# Patient Record
Sex: Female | Born: 1950 | Race: White | Hispanic: No | Marital: Married | Smoking: Never smoker
Health system: Southern US, Community
[De-identification: ages and names within clinical notes are randomized; demographics above are authoritative.]

## PROBLEM LIST (undated history)

## (undated) DIAGNOSIS — R3915 Urgency of urination: Secondary | ICD-10-CM

## (undated) DIAGNOSIS — M1990 Unspecified osteoarthritis, unspecified site: Secondary | ICD-10-CM

## (undated) DIAGNOSIS — Z9289 Personal history of other medical treatment: Secondary | ICD-10-CM

## (undated) DIAGNOSIS — K219 Gastro-esophageal reflux disease without esophagitis: Secondary | ICD-10-CM

## (undated) DIAGNOSIS — M549 Dorsalgia, unspecified: Secondary | ICD-10-CM

## (undated) DIAGNOSIS — M62838 Other muscle spasm: Secondary | ICD-10-CM

## (undated) DIAGNOSIS — F329 Major depressive disorder, single episode, unspecified: Secondary | ICD-10-CM

## (undated) DIAGNOSIS — F32A Depression, unspecified: Secondary | ICD-10-CM

## (undated) DIAGNOSIS — R112 Nausea with vomiting, unspecified: Secondary | ICD-10-CM

## (undated) DIAGNOSIS — D649 Anemia, unspecified: Secondary | ICD-10-CM

## (undated) DIAGNOSIS — R011 Cardiac murmur, unspecified: Secondary | ICD-10-CM

## (undated) DIAGNOSIS — Z860100 Personal history of colon polyps, unspecified: Secondary | ICD-10-CM

## (undated) DIAGNOSIS — R197 Diarrhea, unspecified: Secondary | ICD-10-CM

## (undated) DIAGNOSIS — E785 Hyperlipidemia, unspecified: Secondary | ICD-10-CM

## (undated) DIAGNOSIS — R42 Dizziness and giddiness: Secondary | ICD-10-CM

## (undated) DIAGNOSIS — R350 Frequency of micturition: Secondary | ICD-10-CM

## (undated) DIAGNOSIS — R600 Localized edema: Secondary | ICD-10-CM

## (undated) DIAGNOSIS — F419 Anxiety disorder, unspecified: Secondary | ICD-10-CM

## (undated) DIAGNOSIS — R609 Edema, unspecified: Secondary | ICD-10-CM

## (undated) DIAGNOSIS — G4730 Sleep apnea, unspecified: Secondary | ICD-10-CM

## (undated) DIAGNOSIS — Z8709 Personal history of other diseases of the respiratory system: Secondary | ICD-10-CM

## (undated) DIAGNOSIS — Z9889 Other specified postprocedural states: Secondary | ICD-10-CM

## (undated) DIAGNOSIS — E039 Hypothyroidism, unspecified: Secondary | ICD-10-CM

## (undated) DIAGNOSIS — N3010 Interstitial cystitis (chronic) without hematuria: Secondary | ICD-10-CM

## (undated) DIAGNOSIS — Z8669 Personal history of other diseases of the nervous system and sense organs: Secondary | ICD-10-CM

## (undated) DIAGNOSIS — I499 Cardiac arrhythmia, unspecified: Secondary | ICD-10-CM

## (undated) DIAGNOSIS — J189 Pneumonia, unspecified organism: Secondary | ICD-10-CM

## (undated) DIAGNOSIS — G8929 Other chronic pain: Secondary | ICD-10-CM

## (undated) HISTORY — DX: Major depressive disorder, single episode, unspecified: F32.9

## (undated) HISTORY — PX: LAPAROSCOPIC GASTROTOMY W/ REPAIR OF ULCER: SUR772

## (undated) HISTORY — DX: Anxiety disorder, unspecified: F41.9

## (undated) HISTORY — DX: Depression, unspecified: F32.A

## (undated) HISTORY — DX: Hypothyroidism, unspecified: E03.9

## (undated) HISTORY — PX: COLONOSCOPY: SHX174

## (undated) HISTORY — PX: OTHER SURGICAL HISTORY: SHX169

## (undated) HISTORY — DX: Gastro-esophageal reflux disease without esophagitis: K21.9

## (undated) HISTORY — DX: Dorsalgia, unspecified: M54.9

## (undated) HISTORY — DX: Interstitial cystitis (chronic) without hematuria: N30.10

## (undated) HISTORY — DX: Cardiac murmur, unspecified: R01.1

## (undated) HISTORY — PX: ESOPHAGOGASTRODUODENOSCOPY: SHX1529

---
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
## 1986-11-15 HISTORY — PX: ABDOMINAL HYSTERECTOMY: SHX81

## 2000-11-15 HISTORY — PX: OTHER SURGICAL HISTORY: SHX169

## 2001-11-06 ENCOUNTER — Encounter: Payer: Self-pay | Admitting: Internal Medicine

## 2001-11-06 ENCOUNTER — Ambulatory Visit (HOSPITAL_COMMUNITY): Admission: RE | Admit: 2001-11-06 | Discharge: 2001-11-06 | Payer: Self-pay | Admitting: Internal Medicine

## 2008-08-10 ENCOUNTER — Emergency Department (HOSPITAL_BASED_OUTPATIENT_CLINIC_OR_DEPARTMENT_OTHER): Admission: EM | Admit: 2008-08-10 | Discharge: 2008-08-10 | Payer: Self-pay | Admitting: Emergency Medicine

## 2008-11-15 HISTORY — PX: CHOLECYSTECTOMY: SHX55

## 2011-08-16 LAB — CBC
HCT: 41
Hemoglobin: 14
MCHC: 34
MCV: 84.8
Platelets: 355
RBC: 4.84
RDW: 11.1 — ABNORMAL LOW
WBC: 11.2 — ABNORMAL HIGH

## 2011-08-16 LAB — URINALYSIS, ROUTINE W REFLEX MICROSCOPIC
Bilirubin Urine: NEGATIVE
Glucose, UA: NEGATIVE
Hgb urine dipstick: NEGATIVE
Ketones, ur: NEGATIVE
Nitrite: NEGATIVE
Protein, ur: NEGATIVE
Specific Gravity, Urine: 1.01
Urobilinogen, UA: 0.2
pH: 8.5 — ABNORMAL HIGH

## 2011-08-16 LAB — DIFFERENTIAL
Basophils Absolute: 0
Basophils Relative: 0
Eosinophils Absolute: 0.1
Eosinophils Relative: 1
Lymphocytes Relative: 17
Lymphs Abs: 1.9
Monocytes Absolute: 0.7
Monocytes Relative: 6
Neutro Abs: 8.5 — ABNORMAL HIGH
Neutrophils Relative %: 76

## 2011-08-16 LAB — BASIC METABOLIC PANEL
BUN: 11
CO2: 26
Calcium: 9.9
Chloride: 103
Creatinine, Ser: 0.8
GFR calc Af Amer: >60
GFR calc non Af Amer: >60
Glucose, Bld: 106 — ABNORMAL HIGH
Potassium: 3.9
Sodium: 142

## 2011-08-16 LAB — POCT TOXICOLOGY PANEL: Opiates: POSITIVE

## 2011-08-16 LAB — ETHANOL: Alcohol, Ethyl (B): <5

## 2012-10-03 ENCOUNTER — Encounter: Payer: Self-pay | Admitting: Thoracic Surgery (Cardiothoracic Vascular Surgery)

## 2012-10-10 ENCOUNTER — Encounter: Payer: Self-pay | Admitting: *Deleted

## 2012-10-10 ENCOUNTER — Encounter: Payer: Self-pay | Admitting: Thoracic Surgery (Cardiothoracic Vascular Surgery)

## 2012-10-10 ENCOUNTER — Institutional Professional Consult (permissible substitution) (INDEPENDENT_AMBULATORY_CARE_PROVIDER_SITE_OTHER): Payer: PRIVATE HEALTH INSURANCE | Admitting: Thoracic Surgery (Cardiothoracic Vascular Surgery)

## 2012-10-10 VITALS — BP 126/76 | HR 89 | Resp 16 | Ht 63.0 in | Wt 130.0 lb

## 2012-10-10 DIAGNOSIS — M5124 Other intervertebral disc displacement, thoracic region: Secondary | ICD-10-CM

## 2012-10-10 NOTE — Progress Notes (Signed)
PCP is No primary provider on file. Referring Provider is Pool, Kathaleen Maser, MD  Chief Complaint  Patient presents with  . Herniated Nucleus Propulsus    at T7-8...evaluate for thoracic exposure    HPI: 61 year old woman with chief complaint of back pain and right leg weakness.  Tiffany Villarreal is a 61 year old woman who is been evaluated by Dr. Jordan Likes for microdiscectomy of the thoracic spine T7-T8 level. She has been having a great deal of difficulty with back pain, peripheral neuropathy, and variable weakness in the right leg. An MRI showed herniated disc with impingement on the spinal cord and he has recommended surgery to correct that.   Past Medical History  Diagnosis Date  . Back pain   . Anxiety   . Interstitial cystitis   . Depression   . Hypothyroidism   . Hypertension     Past Surgical History  Procedure Date  . Laparoscopic gastrotomy w/ repair of ulcer   . Cholecystectomy   . Ceasarian   . Tonsillectomy   . Abdominal hysterectomy     No family history on file.  Social History History  Substance Use Topics  . Smoking status: Never Smoker   . Smokeless tobacco: Never Used  . Alcohol Use: No    Current Outpatient Prescriptions  Medication Sig Dispense Refill  . busPIRone (BUSPAR) 15 MG tablet Take 15 mg by mouth 3 (three) times daily.      Marland Kitchen estrogens, conjugated, (PREMARIN) 0.3 MG tablet Take 0.3 mg by mouth daily. Take daily for 21 days then do not take for 7 days.      Marland Kitchen Fe-Succ Ac-C-Thre Ac-B12-FA (TRIFEREX 150 FORTE PO) Take by mouth. Two caps in the morning, two caps in the afternoon      . l-methylfolate-B6-B12 (METANX) 3-35-2 MG TABS Take 1 tablet by mouth daily.      . Meth-Hyo-M Bl-Na Phos-Ph Sal (URIBEL) 118 MG CAPS Take by mouth as needed.      . metoprolol succinate (TOPROL-XL) 50 MG 24 hr tablet Take 50 mg by mouth daily. Take with or immediately following a meal.      . mirtazapine (REMERON) 30 MG tablet Take 30 mg by mouth at bedtime.      .  pentosan polysulfate (ELMIRON) 100 MG capsule Take 100 mg by mouth. Two caps in the morning, two caps at bedtime      . Pitavastatin Calcium (LIVALO) 4 MG TABS Take by mouth every morning.      . sertraline (ZOLOFT) 100 MG tablet Take 100 mg by mouth daily.      Marland Kitchen acetaminophen (TYLENOL) 325 MG tablet Take 650 mg by mouth every 6 (six) hours as needed.      . ALPRAZolam (XANAX) 0.5 MG tablet Take 0.5 mg by mouth at bedtime as needed.      Marland Kitchen buPROPion (WELLBUTRIN SR) 150 MG 12 hr tablet Take 150 mg by mouth 2 (two) times daily.      . DULoxetine (CYMBALTA) 60 MG capsule Take 60 mg by mouth daily.      . furosemide (LASIX) 20 MG tablet Take 20 mg by mouth daily.      Marland Kitchen levothyroxine (SYNTHROID, LEVOTHROID) 88 MCG tablet Take 88 mcg by mouth daily.      . metoCLOPramide (REGLAN) 10 MG tablet Take 10 mg by mouth 4 (four) times daily.      Marland Kitchen olanzapine-FLUoxetine (SYMBYAX) 6-50 MG per capsule Take 1 capsule by mouth every evening.      Marland Kitchen  pregabalin (LYRICA) 150 MG capsule Take 150 mg by mouth 2 (two) times daily.      Marland Kitchen tiZANidine (ZANAFLEX) 2 MG tablet Take 2 mg by mouth every 6 (six) hours as needed.      . zolmitriptan (ZOMIG) 5 MG tablet Take 5 mg by mouth as needed.        Allergies  Allergen Reactions  . Dilaudid (Hydromorphone Hcl) Other (See Comments)    Mental, psychotic  . Morphine And Related Swelling    Of lips and tongue  . Tetanus Toxoids Other (See Comments)    fever  . Compazine (Prochlorperazine Edisylate) Other (See Comments)    Stiff neck    Review of Systems  Constitutional: Positive for activity change and unexpected weight change.  HENT: Positive for hearing loss.   Respiratory: Positive for apnea.   Genitourinary:       Interstitial cystitis  Musculoskeletal: Positive for joint swelling and gait problem.  Neurological: Positive for weakness (right leg) and numbness.       Neuropathy, pain in legs and feet  Psychiatric/Behavioral:       Anxiety, depression  All  other systems reviewed and are negative.    BP 126/76  Pulse 89  Resp 16  Ht 5\' 3"  (1.6 m)  Wt 130 lb (58.968 kg)  BMI 23.03 kg/m2  SpO2 98% Physical Exam  Constitutional: She is oriented to person, place, and time. She appears well-developed and well-nourished.       very anxious  HENT:  Head: Normocephalic and atraumatic.  Eyes: EOM are normal. Pupils are equal, round, and reactive to light.  Neck: Neck supple. No thyromegaly present.  Cardiovascular: Normal rate, regular rhythm, normal heart sounds and intact distal pulses.  Exam reveals no gallop and no friction rub.   No murmur heard. Pulmonary/Chest: Breath sounds normal. She has no wheezes. She has no rales.  Abdominal: Soft. There is no tenderness.  Musculoskeletal: She exhibits no edema.  Lymphadenopathy:    She has no cervical adenopathy.  Neurological: She is alert and oriented to person, place, and time. No cranial nerve deficit.  Skin: Skin is warm and dry.     Diagnostic Tests: MR of spine 09/01/2012 Central herniated disc at T7-T8 with mild cord compression but no nerve root compression. Enhancing lesion of T9 vertebral body.  Impression: 61 year old woman with a herniated disc he needs thoracic exposure for disc surgery. I discussed with her the incision that we would use, use of a double-lumen endotracheal tube, chest tube management postoperatively, and pain management postoperatively. We discussed the risks associated with the exposure including those related to general anesthesia, bleeding, infection, and pain issues both acute and chronic. She understands those issues but is very anxious about having surgery. She has had adverse effects narcotics in the past and is likely to have issues in that regard again. She also has had problems with severe nausea following surgery, and I did warn her that that is likely to occur as well.  Plan: I will be happy to provide exposure for Dr. Jordan Likes, we will coordinate  scheduling with his office.

## 2012-10-20 ENCOUNTER — Other Ambulatory Visit: Payer: Self-pay | Admitting: Neurosurgery

## 2012-10-23 ENCOUNTER — Encounter (HOSPITAL_COMMUNITY): Payer: Self-pay | Admitting: Pharmacy Technician

## 2012-10-27 ENCOUNTER — Encounter (HOSPITAL_COMMUNITY): Payer: Self-pay

## 2012-10-27 ENCOUNTER — Ambulatory Visit (HOSPITAL_COMMUNITY)
Admission: RE | Admit: 2012-10-27 | Discharge: 2012-10-27 | Disposition: A | Discharge status: Home / Self Care | Payer: PRIVATE HEALTH INSURANCE | Source: Ambulatory Visit | Attending: Anesthesiology | Admitting: Anesthesiology

## 2012-10-27 ENCOUNTER — Encounter (HOSPITAL_COMMUNITY)
Admission: RE | Admit: 2012-10-27 | Discharge: 2012-10-27 | Disposition: A | Discharge status: Home / Self Care | Payer: PRIVATE HEALTH INSURANCE | Source: Ambulatory Visit | Attending: Neurosurgery | Admitting: Neurosurgery

## 2012-10-27 DIAGNOSIS — M5104 Intervertebral disc disorders with myelopathy, thoracic region: Secondary | ICD-10-CM | POA: Insufficient documentation

## 2012-10-27 DIAGNOSIS — Z01818 Encounter for other preprocedural examination: Secondary | ICD-10-CM | POA: Insufficient documentation

## 2012-10-27 HISTORY — DX: Personal history of other diseases of the nervous system and sense organs: Z86.69

## 2012-10-27 HISTORY — DX: Nausea with vomiting, unspecified: Z98.890

## 2012-10-27 HISTORY — DX: Other chronic pain: G89.29

## 2012-10-27 HISTORY — DX: Anemia, unspecified: D64.9

## 2012-10-27 HISTORY — DX: Pneumonia, unspecified organism: J18.9

## 2012-10-27 HISTORY — DX: Cardiac arrhythmia, unspecified: I49.9

## 2012-10-27 HISTORY — DX: Dorsalgia, unspecified: M54.9

## 2012-10-27 HISTORY — DX: Edema, unspecified: R60.9

## 2012-10-27 HISTORY — DX: Localized edema: R60.0

## 2012-10-27 HISTORY — DX: Other muscle spasm: M62.838

## 2012-10-27 HISTORY — DX: Personal history of colon polyps, unspecified: Z86.0100

## 2012-10-27 HISTORY — DX: Personal history of other diseases of the respiratory system: Z87.09

## 2012-10-27 HISTORY — DX: Dizziness and giddiness: R42

## 2012-10-27 HISTORY — DX: Hyperlipidemia, unspecified: E78.5

## 2012-10-27 HISTORY — DX: Personal history of colonic polyps: Z86.010

## 2012-10-27 HISTORY — DX: Urgency of urination: R39.15

## 2012-10-27 HISTORY — DX: Unspecified osteoarthritis, unspecified site: M19.90

## 2012-10-27 HISTORY — DX: Other specified postprocedural states: R11.2

## 2012-10-27 HISTORY — DX: Diarrhea, unspecified: R19.7

## 2012-10-27 HISTORY — DX: Personal history of other medical treatment: Z92.89

## 2012-10-27 HISTORY — DX: Sleep apnea, unspecified: G47.30

## 2012-10-27 HISTORY — DX: Frequency of micturition: R35.0

## 2012-10-27 LAB — CBC WITH DIFFERENTIAL/PLATELET
Eosinophils Relative: 2 % (ref 0–5)
HCT: 39.6 % (ref 36.0–46.0)
Hemoglobin: 12.8 g/dL (ref 12.0–15.0)
Lymphocytes Relative: 22 % (ref 12–46)
Lymphs Abs: 1.8 10*3/uL (ref 0.7–4.0)
MCV: 90.6 fL (ref 78.0–100.0)
Monocytes Absolute: 0.7 10*3/uL (ref 0.1–1.0)
Neutro Abs: 5.3 10*3/uL (ref 1.7–7.7)
RBC: 4.37 MIL/uL (ref 3.87–5.11)
WBC: 7.9 10*3/uL (ref 4.0–10.5)

## 2012-10-27 LAB — BASIC METABOLIC PANEL
BUN: 17 mg/dL (ref 6–23)
CO2: 27 mEq/L (ref 19–32)
Chloride: 104 mEq/L (ref 96–112)
Glucose, Bld: 92 mg/dL (ref 70–99)
Potassium: 3.5 mEq/L (ref 3.5–5.1)

## 2012-10-27 LAB — TYPE AND SCREEN: ABO/RH(D): B POS

## 2012-10-27 LAB — ABO/RH: ABO/RH(D): B POS

## 2012-10-27 NOTE — Progress Notes (Signed)
Cardiologist is Dr Eliot Ford with [facility] in [place]-hasn't seen him in yrs and only has to see on an as needed basis  Echo and stress test done 15+yrs ago Denies ever having a heart cath  Medical MD is Dr.William Ameen with [facility] in [place]    Denies EKG and CXR within past yr

## 2012-10-27 NOTE — Progress Notes (Signed)
Sleep study done in 2000/ 2005-setting at a 10-uses CPAP

## 2012-10-27 NOTE — Pre-Procedure Instructions (Signed)
20 Ceilidh Torregrossa  10/27/2012   Your procedure is scheduled on:  Mon, Dec 16 @ 7:30 AM  Report to [facility] at 5:30 AM.  Call this number if you have problems the morning of surgery: [phone]   Remember:   Do not eat food:After Midnight.  Take these medicines the morning of surgery with A SIP OF WATER: Sertraline(Zoloft),Metoprolol(Toprol),Pain Pill(if needed),Buspar(Buspirone)Azelastine(Astelin--if needed), and Adderall   Do not wear jewelry, make-up or nail polish.  Do not wear lotions, powders, or perfumes. You may wear deodorant.  Do not shave 48 hours prior to surgery.   Do not bring valuables to the hospital.  Contacts, dentures or bridgework may not be worn into surgery.  Leave suitcase in the car. After surgery it may be brought to your room.  For patients admitted to the hospital, checkout time is 11:00 AM the day of discharge.   Patients discharged the day of surgery will not be allowed to drive home.    Special Instructions: Shower using CHG 2 nights before surgery and the night before surgery.  If you shower the day of surgery use CHG.  Use special wash - you have one bottle of CHG for all showers.  You should use approximately 1/3 of the bottle for each shower.   Please read over the following fact sheets that you were given: Pain Booklet, Coughing and Deep Breathing, Blood Transfusion Information, MRSA Information and Surgical Site Infection Prevention

## 2012-10-29 MED ORDER — CEFAZOLIN SODIUM-DEXTROSE 2-3 GM-% IV SOLR
2.0000 g | INTRAVENOUS | Status: AC
  Administered 2012-10-30: 2 g via INTRAVENOUS
  Filled 2012-10-29: qty 50

## 2012-10-29 MED ORDER — DEXAMETHASONE SODIUM PHOSPHATE 10 MG/ML IJ SOLN
10.0000 mg | INTRAMUSCULAR | Status: AC
  Administered 2012-10-30: 10 mg via INTRAVENOUS
  Filled 2012-10-29: qty 1

## 2012-10-30 ENCOUNTER — Inpatient Hospital Stay (HOSPITAL_COMMUNITY): Payer: PRIVATE HEALTH INSURANCE

## 2012-10-30 ENCOUNTER — Encounter (HOSPITAL_COMMUNITY)
Admission: RE | Disposition: A | Discharge status: Home / Self Care | Payer: Self-pay | Source: Ambulatory Visit | Attending: Neurosurgery

## 2012-10-30 ENCOUNTER — Encounter (HOSPITAL_COMMUNITY): Payer: Self-pay | Admitting: Certified Registered Nurse Anesthetist

## 2012-10-30 ENCOUNTER — Inpatient Hospital Stay (HOSPITAL_COMMUNITY)
Admission: RE | Admit: 2012-10-30 | Discharge: 2012-11-01 | DRG: 491 | Disposition: A | Discharge status: Home / Self Care | Payer: PRIVATE HEALTH INSURANCE | Source: Ambulatory Visit | Attending: Neurosurgery | Admitting: Neurosurgery

## 2012-10-30 ENCOUNTER — Ambulatory Visit (HOSPITAL_COMMUNITY): Payer: PRIVATE HEALTH INSURANCE

## 2012-10-30 ENCOUNTER — Ambulatory Visit (HOSPITAL_COMMUNITY): Payer: PRIVATE HEALTH INSURANCE | Admitting: Certified Registered Nurse Anesthetist

## 2012-10-30 ENCOUNTER — Encounter (HOSPITAL_COMMUNITY): Payer: Self-pay | Admitting: Surgery

## 2012-10-30 DIAGNOSIS — M5104 Intervertebral disc disorders with myelopathy, thoracic region: Principal | ICD-10-CM | POA: Diagnosis present

## 2012-10-30 DIAGNOSIS — F329 Major depressive disorder, single episode, unspecified: Secondary | ICD-10-CM | POA: Diagnosis present

## 2012-10-30 DIAGNOSIS — Z79899 Other long term (current) drug therapy: Secondary | ICD-10-CM

## 2012-10-30 DIAGNOSIS — F3289 Other specified depressive episodes: Secondary | ICD-10-CM | POA: Diagnosis present

## 2012-10-30 DIAGNOSIS — I499 Cardiac arrhythmia, unspecified: Secondary | ICD-10-CM | POA: Diagnosis present

## 2012-10-30 DIAGNOSIS — G4730 Sleep apnea, unspecified: Secondary | ICD-10-CM | POA: Diagnosis present

## 2012-10-30 DIAGNOSIS — F411 Generalized anxiety disorder: Secondary | ICD-10-CM | POA: Diagnosis present

## 2012-10-30 DIAGNOSIS — E785 Hyperlipidemia, unspecified: Secondary | ICD-10-CM | POA: Diagnosis present

## 2012-10-30 DIAGNOSIS — R209 Unspecified disturbances of skin sensation: Secondary | ICD-10-CM | POA: Diagnosis present

## 2012-10-30 HISTORY — PX: THORACIC DISCECTOMY: SHX6113

## 2012-10-30 HISTORY — PX: THORACIC EXPOSURE: SHX6114

## 2012-10-30 SURGERY — THORACIC DISCECTOMY
Anesthesia: General | Site: Back | Laterality: Right | Wound class: Clean

## 2012-10-30 MED ORDER — LIDOCAINE HCL (CARDIAC) 20 MG/ML IV SOLN
INTRAVENOUS | Status: DC | PRN
  Administered 2012-10-30: 65 mg via INTRAVENOUS

## 2012-10-30 MED ORDER — MIDAZOLAM HCL 2 MG/2ML IJ SOLN: 1.0000 mg | INTRAMUSCULAR | Status: DC | PRN

## 2012-10-30 MED ORDER — LACTATED RINGERS IV SOLN
INTRAVENOUS | Status: DC | PRN
  Administered 2012-10-30: 07:00:00 via INTRAVENOUS

## 2012-10-30 MED ORDER — ALUM & MAG HYDROXIDE-SIMETH 200-200-20 MG/5ML PO SUSP: 30.0000 mL | Freq: Four times a day (QID) | ORAL | Status: DC | PRN

## 2012-10-30 MED ORDER — FENTANYL CITRATE 0.05 MG/ML IJ SOLN
INTRAMUSCULAR | Status: DC | PRN
  Administered 2012-10-30 (×2): 100 ug via INTRAVENOUS
  Administered 2012-10-30: 50 ug via INTRAVENOUS

## 2012-10-30 MED ORDER — BUSPIRONE HCL 15 MG PO TABS
15.0000 mg | ORAL_TABLET | Freq: Three times a day (TID) | ORAL | Status: DC
  Administered 2012-10-30 – 2012-11-01 (×6): 15 mg via ORAL
  Filled 2012-10-30 (×9): qty 1

## 2012-10-30 MED ORDER — PENTOSAN POLYSULFATE SODIUM 100 MG PO CAPS
100.0000 mg | ORAL_CAPSULE | Freq: Two times a day (BID) | ORAL | Status: DC
  Administered 2012-10-30 – 2012-11-01 (×5): 100 mg via ORAL
  Filled 2012-10-30 (×6): qty 1

## 2012-10-30 MED ORDER — MENTHOL 3 MG MT LOZG: 1.0000 | LOZENGE | OROMUCOSAL | Status: DC | PRN

## 2012-10-30 MED ORDER — METOPROLOL SUCCINATE ER 50 MG PO TB24
50.0000 mg | ORAL_TABLET | Freq: Every day | ORAL | Status: DC
  Filled 2012-10-30 (×6): qty 1

## 2012-10-30 MED ORDER — GLYCOPYRROLATE 0.2 MG/ML IJ SOLN
INTRAMUSCULAR | Status: DC | PRN
  Administered 2012-10-30: 0.6 mg via INTRAVENOUS

## 2012-10-30 MED ORDER — SODIUM CHLORIDE 0.9 % IR SOLN
Status: DC | PRN
  Administered 2012-10-30: 09:00:00

## 2012-10-30 MED ORDER — ZOLPIDEM TARTRATE 5 MG PO TABS: 5.0000 mg | ORAL_TABLET | Freq: Every evening | ORAL | Status: DC | PRN

## 2012-10-30 MED ORDER — NEOSTIGMINE METHYLSULFATE 1 MG/ML IJ SOLN
INTRAMUSCULAR | Status: DC | PRN
  Administered 2012-10-30: 4 mg via INTRAVENOUS

## 2012-10-30 MED ORDER — SUMATRIPTAN SUCCINATE 100 MG PO TABS
100.0000 mg | ORAL_TABLET | Freq: Once | ORAL | Status: AC
  Administered 2012-10-30: 100 mg via ORAL
  Filled 2012-10-30: qty 1

## 2012-10-30 MED ORDER — CEFAZOLIN SODIUM 1-5 GM-% IV SOLN
1.0000 g | Freq: Three times a day (TID) | INTRAVENOUS | Status: AC
  Administered 2012-10-30 – 2012-10-31 (×2): 1 g via INTRAVENOUS
  Filled 2012-10-30 (×2): qty 50

## 2012-10-30 MED ORDER — ATORVASTATIN CALCIUM 20 MG PO TABS
20.0000 mg | ORAL_TABLET | Freq: Every day | ORAL | Status: DC
  Administered 2012-10-30: 20 mg via ORAL
  Filled 2012-10-30 (×3): qty 1

## 2012-10-30 MED ORDER — THROMBIN 5000 UNITS EX SOLR
OROMUCOSAL | Status: DC | PRN
  Administered 2012-10-30: 09:00:00 via TOPICAL

## 2012-10-30 MED ORDER — ONDANSETRON HCL 4 MG/2ML IJ SOLN
4.0000 mg | INTRAMUSCULAR | Status: DC | PRN
  Administered 2012-10-30: 4 mg via INTRAVENOUS
  Filled 2012-10-30: qty 2

## 2012-10-30 MED ORDER — KETOROLAC TROMETHAMINE 30 MG/ML IJ SOLN
30.0000 mg | Freq: Four times a day (QID) | INTRAMUSCULAR | Status: DC
  Administered 2012-10-30 – 2012-10-31 (×3): 30 mg via INTRAVENOUS
  Filled 2012-10-30 (×7): qty 1

## 2012-10-30 MED ORDER — FENTANYL CITRATE 0.05 MG/ML IJ SOLN
25.0000 ug | INTRAMUSCULAR | Status: DC | PRN
  Administered 2012-10-30 (×2): 50 ug via INTRAVENOUS

## 2012-10-30 MED ORDER — BACITRACIN 50000 UNITS IM SOLR
INTRAMUSCULAR | Status: AC
  Administered 2012-10-30: 08:00:00
  Filled 2012-10-30: qty 1

## 2012-10-30 MED ORDER — SODIUM CHLORIDE 0.9 % IJ SOLN
3.0000 mL | Freq: Two times a day (BID) | INTRAMUSCULAR | Status: DC
  Administered 2012-10-30 – 2012-10-31 (×2): 3 mL via INTRAVENOUS

## 2012-10-30 MED ORDER — AMPHETAMINE-DEXTROAMPHETAMINE 20 MG PO TABS: 30.0000 mg | ORAL_TABLET | Freq: Every morning | ORAL | Status: DC

## 2012-10-30 MED ORDER — TIZANIDINE HCL 2 MG PO TABS
2.0000 mg | ORAL_TABLET | Freq: Four times a day (QID) | ORAL | Status: DC | PRN
  Administered 2012-11-01: 2 mg via ORAL
  Filled 2012-10-30: qty 1

## 2012-10-30 MED ORDER — HYDROMORPHONE HCL PF 1 MG/ML IJ SOLN: 0.2500 mg | INTRAMUSCULAR | Status: DC | PRN

## 2012-10-30 MED ORDER — SERTRALINE HCL 100 MG PO TABS
100.0000 mg | ORAL_TABLET | Freq: Every day | ORAL | Status: DC
  Administered 2012-10-30 – 2012-11-01 (×3): 100 mg via ORAL
  Filled 2012-10-30 (×3): qty 1

## 2012-10-30 MED ORDER — AZELASTINE HCL 0.1 % NA SOLN
2.0000 | Freq: Two times a day (BID) | NASAL | Status: DC
  Administered 2012-10-30 – 2012-11-01 (×4): 2 via NASAL
  Filled 2012-10-30: qty 30

## 2012-10-30 MED ORDER — ESTROGENS CONJUGATED 0.3 MG PO TABS
0.3000 mg | ORAL_TABLET | Freq: Every day | ORAL | Status: DC
  Administered 2012-10-30 – 2012-11-01 (×3): 0.3 mg via ORAL
  Filled 2012-10-30 (×3): qty 1

## 2012-10-30 MED ORDER — PHENOL 1.4 % MT LIQD: 1.0000 | OROMUCOSAL | Status: DC | PRN

## 2012-10-30 MED ORDER — FENTANYL CITRATE 0.05 MG/ML IJ SOLN
50.0000 ug | Freq: Once | INTRAMUSCULAR | Status: DC
  Filled 2012-10-30 (×2): qty 2

## 2012-10-30 MED ORDER — POLYETHYLENE GLYCOL 3350 17 G PO PACK
17.0000 g | PACK | Freq: Every day | ORAL | Status: DC | PRN
  Filled 2012-10-30: qty 1

## 2012-10-30 MED ORDER — OXYCODONE-ACETAMINOPHEN 5-325 MG PO TABS: 1.0000 | ORAL_TABLET | ORAL | Status: DC | PRN

## 2012-10-30 MED ORDER — KETOROLAC TROMETHAMINE 30 MG/ML IJ SOLN
INTRAMUSCULAR | Status: AC
  Administered 2012-10-30: 30 mg via INTRAVENOUS
  Filled 2012-10-30: qty 1

## 2012-10-30 MED ORDER — SODIUM CHLORIDE 0.9 % IV SOLN
INTRAVENOUS | Status: DC
  Administered 2012-10-31: 1000 mL via INTRAVENOUS

## 2012-10-30 MED ORDER — VECURONIUM BROMIDE 10 MG IV SOLR
INTRAVENOUS | Status: DC | PRN
  Administered 2012-10-30: 2 mg via INTRAVENOUS

## 2012-10-30 MED ORDER — FENTANYL CITRATE 0.05 MG/ML IJ SOLN
INTRAMUSCULAR | Status: AC
  Administered 2012-10-30: 50 ug via INTRAVENOUS
  Filled 2012-10-30: qty 2

## 2012-10-30 MED ORDER — DIAZEPAM 5 MG PO TABS: 5.0000 mg | ORAL_TABLET | Freq: Four times a day (QID) | ORAL | Status: DC | PRN

## 2012-10-30 MED ORDER — BISACODYL 10 MG RE SUPP: 10.0000 mg | Freq: Every day | RECTAL | Status: DC | PRN

## 2012-10-30 MED ORDER — ACETAMINOPHEN 325 MG PO TABS: 650.0000 mg | ORAL_TABLET | ORAL | Status: DC | PRN

## 2012-10-30 MED ORDER — ROCURONIUM BROMIDE 100 MG/10ML IV SOLN
INTRAVENOUS | Status: DC | PRN
  Administered 2012-10-30: 50 mg via INTRAVENOUS

## 2012-10-30 MED ORDER — ONDANSETRON HCL 4 MG/2ML IJ SOLN
INTRAMUSCULAR | Status: DC | PRN
  Administered 2012-10-30 (×2): 4 mg via INTRAVENOUS

## 2012-10-30 MED ORDER — SODIUM CHLORIDE 0.9 % IV SOLN
INTRAVENOUS | Status: AC
  Filled 2012-10-30: qty 500

## 2012-10-30 MED ORDER — PROMETHAZINE HCL 25 MG/ML IJ SOLN: 6.2500 mg | INTRAMUSCULAR | Status: DC | PRN

## 2012-10-30 MED ORDER — THROMBIN 5000 UNITS EX KIT
PACK | CUTANEOUS | Status: DC | PRN
  Administered 2012-10-30 (×2): 5000 [IU] via TOPICAL

## 2012-10-30 MED ORDER — FLEET ENEMA 7-19 GM/118ML RE ENEM
1.0000 | ENEMA | Freq: Once | RECTAL | Status: AC | PRN
  Filled 2012-10-30: qty 1

## 2012-10-30 MED ORDER — MIDAZOLAM HCL 5 MG/5ML IJ SOLN
INTRAMUSCULAR | Status: DC | PRN
  Administered 2012-10-30: 2 mg via INTRAVENOUS

## 2012-10-30 MED ORDER — CLONAZEPAM 1 MG PO TABS
3.0000 mg | ORAL_TABLET | Freq: Every day | ORAL | Status: DC
  Administered 2012-10-30 – 2012-10-31 (×2): 3 mg via ORAL
  Filled 2012-10-30 (×2): qty 3

## 2012-10-30 MED ORDER — 0.9 % SODIUM CHLORIDE (POUR BTL) OPTIME
TOPICAL | Status: DC | PRN
  Administered 2012-10-30: 1000 mL

## 2012-10-30 MED ORDER — PROPOFOL 10 MG/ML IV BOLUS
INTRAVENOUS | Status: DC | PRN
  Administered 2012-10-30: 200 mg via INTRAVENOUS

## 2012-10-30 MED ORDER — SODIUM CHLORIDE 0.9 % IV SOLN
10.0000 mg | INTRAVENOUS | Status: DC | PRN
  Administered 2012-10-30: 25 ug/min via INTRAVENOUS

## 2012-10-30 MED ORDER — PHENYLEPHRINE HCL 10 MG/ML IJ SOLN
INTRAMUSCULAR | Status: DC | PRN
  Administered 2012-10-30 (×6): 40 ug via INTRAVENOUS

## 2012-10-30 MED ORDER — FENTANYL CITRATE 0.05 MG/ML IJ SOLN
12.5000 ug | INTRAMUSCULAR | Status: DC | PRN
  Administered 2012-10-30 – 2012-10-31 (×6): 25 ug via INTRAVENOUS
  Filled 2012-10-30 (×4): qty 2

## 2012-10-30 MED ORDER — ACETAMINOPHEN 650 MG RE SUPP: 650.0000 mg | RECTAL | Status: DC | PRN

## 2012-10-30 MED ORDER — SENNA 8.6 MG PO TABS
1.0000 | ORAL_TABLET | Freq: Two times a day (BID) | ORAL | Status: DC
  Administered 2012-10-30 – 2012-11-01 (×5): 8.6 mg via ORAL
  Filled 2012-10-30 (×6): qty 1

## 2012-10-30 MED ORDER — KETOROLAC TROMETHAMINE 30 MG/ML IJ SOLN
30.0000 mg | Freq: Once | INTRAMUSCULAR | Status: AC
  Administered 2012-10-30: 30 mg via INTRAVENOUS

## 2012-10-30 MED ORDER — HYDROCODONE-ACETAMINOPHEN 10-325 MG PO TABS
1.0000 | ORAL_TABLET | Freq: Every day | ORAL | Status: DC | PRN
  Administered 2012-10-30: 2 via ORAL
  Filled 2012-10-30 (×2): qty 2

## 2012-10-30 MED ORDER — FENTANYL CITRATE 0.05 MG/ML IJ SOLN: 50.0000 ug | Freq: Once | INTRAMUSCULAR | Status: DC

## 2012-10-30 MED ORDER — AMPHETAMINE-DEXTROAMPHETAMINE 10 MG PO TABS: 30.0000 mg | ORAL_TABLET | Freq: Every day | ORAL | Status: DC

## 2012-10-30 MED ORDER — SODIUM CHLORIDE 0.9 % IV SOLN: 250.0000 mL | INTRAVENOUS | Status: DC

## 2012-10-30 MED ORDER — MIRTAZAPINE 30 MG PO TABS
30.0000 mg | ORAL_TABLET | Freq: Every day | ORAL | Status: DC
  Administered 2012-10-30 – 2012-10-31 (×2): 30 mg via ORAL
  Filled 2012-10-30 (×3): qty 1

## 2012-10-30 MED ORDER — SODIUM CHLORIDE 0.9 % IJ SOLN: 3.0000 mL | INTRAMUSCULAR | Status: DC | PRN

## 2012-10-30 SURGICAL SUPPLY — 89 items
APPLIER CLIP ROT 10 11.4 M/L (STAPLE)
BAG DECANTER FOR FLEXI CONT (MISCELLANEOUS) ×3 IMPLANT
BENZOIN TINCTURE PRP APPL 2/3 (GAUZE/BANDAGES/DRESSINGS) ×3 IMPLANT
BLADE SURG ROTATE 9660 (MISCELLANEOUS) IMPLANT
BRUSH SCRUB EZ PLAIN DRY (MISCELLANEOUS) ×3 IMPLANT
BUR CUTTER 7.0 ROUND (BURR) ×6 IMPLANT
BUR MATCHSTICK NEURO 3.0 LAGG (BURR) ×3 IMPLANT
CANISTER SUCTION 2500CC (MISCELLANEOUS) ×6 IMPLANT
CATH HYDRAGLIDE XL THORACIC (CATHETERS) IMPLANT
CATH THORACIC 28FR (CATHETERS) IMPLANT
CATH THORACIC 36FR (CATHETERS) IMPLANT
CATH THORACIC 36FR RT ANG (CATHETERS) IMPLANT
CLIP APPLIE ROT 10 11.4 M/L (STAPLE) IMPLANT
CLIP TI MEDIUM 6 (CLIP) ×6 IMPLANT
CLOTH BEACON ORANGE TIMEOUT ST (SAFETY) ×6 IMPLANT
CONNECTOR ×3 IMPLANT
CONT SPEC 4OZ CLIKSEAL STRL BL (MISCELLANEOUS) ×3 IMPLANT
COVER SURGICAL LIGHT HANDLE (MISCELLANEOUS) ×6 IMPLANT
DECANTER SPIKE VIAL GLASS SM (MISCELLANEOUS) ×6 IMPLANT
DERMABOND ADVANCED (GAUZE/BANDAGES/DRESSINGS)
DERMABOND ADVANCED .7 DNX12 (GAUZE/BANDAGES/DRESSINGS) IMPLANT
DRAPE LAPAROSCOPIC ABDOMINAL (DRAPES) ×3 IMPLANT
DRAPE LAPAROTOMY 100X72X124 (DRAPES) ×3 IMPLANT
DRAPE MICROSCOPE LEICA (MISCELLANEOUS) ×3 IMPLANT
DRAPE MICROSCOPE ZEISS OPMI (DRAPES) IMPLANT
DRAPE POUCH INSTRU U-SHP 10X18 (DRAPES) ×3 IMPLANT
DRAPE PROXIMA HALF (DRAPES) IMPLANT
DRAPE SURG 17X23 STRL (DRAPES) ×12 IMPLANT
DRILL BIT 7/64X5 (BIT) IMPLANT
ELECT REM PT RETURN 9FT ADLT (ELECTROSURGICAL) ×6
ELECTRODE REM PT RTRN 9FT ADLT (ELECTROSURGICAL) ×4 IMPLANT
GAUZE SPONGE 4X4 16PLY XRAY LF (GAUZE/BANDAGES/DRESSINGS) IMPLANT
GLOVE BIO SURGEON STRL SZ 6 (GLOVE) ×12 IMPLANT
GLOVE BIOGEL PI IND STRL 6.5 (GLOVE) ×2 IMPLANT
GLOVE BIOGEL PI IND STRL 7.0 (GLOVE) ×4 IMPLANT
GLOVE BIOGEL PI INDICATOR 6.5 (GLOVE) ×1
GLOVE BIOGEL PI INDICATOR 7.0 (GLOVE) ×2
GLOVE ECLIPSE 8.5 STRL (GLOVE) ×3 IMPLANT
GLOVE EUDERMIC 7 POWDERFREE (GLOVE) ×3 IMPLANT
GLOVE EXAM NITRILE LRG STRL (GLOVE) IMPLANT
GLOVE EXAM NITRILE MD LF STRL (GLOVE) IMPLANT
GLOVE EXAM NITRILE XL STR (GLOVE) IMPLANT
GLOVE EXAM NITRILE XS STR PU (GLOVE) IMPLANT
GLOVE INDICATOR 7.0 STRL GRN (GLOVE) ×3 IMPLANT
GLOVE SS BIOGEL STRL SZ 6.5 (GLOVE) ×4 IMPLANT
GLOVE SUPERSENSE BIOGEL SZ 6.5 (GLOVE) ×2
GLOVE SURG EUDERMIC 8 LTX PF (GLOVE) ×9 IMPLANT
GLOVE SURG SS PI 6.5 STRL IVOR (GLOVE) ×6 IMPLANT
GOWN BRE IMP SLV AUR LG STRL (GOWN DISPOSABLE) IMPLANT
GOWN BRE IMP SLV AUR XL STRL (GOWN DISPOSABLE) ×18 IMPLANT
GOWN STRL NON-REIN LRG LVL3 (GOWN DISPOSABLE) ×6 IMPLANT
GOWN STRL REIN 2XL LVL4 (GOWN DISPOSABLE) IMPLANT
HEMOSTAT SURGICEL 2X14 (HEMOSTASIS) IMPLANT
KIT BASIN OR (CUSTOM PROCEDURE TRAY) ×6 IMPLANT
KIT ROOM TURNOVER OR (KITS) ×6 IMPLANT
NEEDLE HYPO 22GX1.5 SAFETY (NEEDLE) ×3 IMPLANT
NEEDLE SPNL 22GX3.5 QUINCKE BK (NEEDLE) ×3 IMPLANT
NS IRRIG 1000ML POUR BTL (IV SOLUTION) ×9 IMPLANT
PACK CHEST (CUSTOM PROCEDURE TRAY) ×3 IMPLANT
PACK LAMINECTOMY NEURO (CUSTOM PROCEDURE TRAY) ×3 IMPLANT
PAD ARMBOARD 7.5X6 YLW CONV (MISCELLANEOUS) ×15 IMPLANT
PAD SHARPS MAGNETIC DISPOSAL (MISCELLANEOUS) ×3 IMPLANT
RUBBERBAND STERILE (MISCELLANEOUS) ×6 IMPLANT
SPONGE GAUZE 4X4 12PLY (GAUZE/BANDAGES/DRESSINGS) IMPLANT
SPONGE SURGIFOAM ABS GEL SZ50 (HEMOSTASIS) IMPLANT
STAPLER VISISTAT 35W (STAPLE) IMPLANT
STRIP CLOSURE SKIN 1/2X4 (GAUZE/BANDAGES/DRESSINGS) ×3 IMPLANT
SUT SILK  1 MH (SUTURE) ×1
SUT SILK 0 FSL (SUTURE) ×6 IMPLANT
SUT SILK 1 MH (SUTURE) ×2 IMPLANT
SUT SILK 2 0SH CR/8 30 (SUTURE) IMPLANT
SUT VIC AB 1 CTX 18 (SUTURE) ×3 IMPLANT
SUT VIC AB 1 CTX 36 (SUTURE) ×2
SUT VIC AB 1 CTX36XBRD ANBCTR (SUTURE) ×4 IMPLANT
SUT VIC AB 2-0 CT1 18 (SUTURE) ×3 IMPLANT
SUT VIC AB 2-0 CTX 36 (SUTURE) ×6 IMPLANT
SUT VIC AB 3-0 SH 8-18 (SUTURE) ×3 IMPLANT
SUT VIC AB 3-0 X1 27 (SUTURE) ×3 IMPLANT
SUT VICRYL 2 TP 1 (SUTURE) ×3 IMPLANT
SYR 20ML ECCENTRIC (SYRINGE) ×3 IMPLANT
SYR BULB IRRIGATION 50ML (SYRINGE) ×3 IMPLANT
SYSTEM SAHARA CHEST DRAIN RE-I (WOUND CARE) IMPLANT
TAPE CLOTH 4X10 WHT NS (GAUZE/BANDAGES/DRESSINGS) ×3 IMPLANT
TAPE CLOTH SURG 4X10 WHT LF (GAUZE/BANDAGES/DRESSINGS) ×3 IMPLANT
TOWEL OR 17X24 6PK STRL BLUE (TOWEL DISPOSABLE) ×6 IMPLANT
TOWEL OR 17X26 10 PK STRL BLUE (TOWEL DISPOSABLE) ×9 IMPLANT
TRAY FOLEY CATH 14FRSI W/METER (CATHETERS) ×3 IMPLANT
TUNNELER SHEATH ON-Q 11GX8 (MISCELLANEOUS) IMPLANT
WATER STERILE IRR 1000ML POUR (IV SOLUTION) ×9 IMPLANT

## 2012-10-30 NOTE — H&P (Signed)
Tiffany Villarreal is an 61 y.o. female.   Chief Complaint: Back and bilateral leg pain HPI: 61 year old female with mid back pain with radiation into her abdomen and bilateral lower trimming these. Patient has progressive numbness paresthesias dysesthesias and some degree of ataxia consist myelopathy. Workup has demonstrated evidence of a large T7-8 disc herniation with marked spinal cord compression. Patient presents now for right-sided transthoracic microdiscectomy at T7-8.  Past Medical History  Diagnosis Date  . Back pain   . Interstitial cystitis     takes Uribel and Elmiron bid   . Dysrhythmia     takes Metoprolol daily  . Hyperlipidemia     takes Livalo daily  . Sleep apnea     uses CPAP  . Pneumonia     last time a few yrs ago  . History of bronchitis     recently and was on and has completed an antibiotic  . History of migraine     last one a few wks ago and takes ZOmig prn  . Dizziness     related spine   . Arthritis   . Peripheral edema   . Chronic back pain   . Diarrhea     since stomach surgeries  . History of colon polyps   . Urinary frequency   . Urinary urgency   . Muscle spasms of head and/or neck     takes Zanaflex prn  . Osteoarthritis   . History of blood transfusion     no abnormal reaction noted  . Hypothyroidism     but doesn't require meds  . Anemia     takes Iron caps daily  . Depression     takes Buspar,Klonopin,and Sertraline daily  . Anxiety     takes Remeron and Adderall  . PONV (postoperative nausea and vomiting)     extreme vomiting;pt states she had to have a picc line  after 2 surgeries    Past Surgical History  Procedure Date  . Laparoscopic gastrotomy w/ repair of ulcer   . Ceasarian   . Cholecystectomy 2010  . Bladder tumor removed 2002    benign  . Abdominal hysterectomy 1988  . Appendectomy 1970  . Tonsillectomy 1969  . Colonoscopy   . Esophagogastroduodenoscopy     History reviewed. No pertinent family  history. Social History:  reports that she has never smoked. She has never used smokeless tobacco. She reports that she does not drink alcohol or use illicit drugs.  Allergies:  Allergies  Allergen Reactions  . Dilaudid (Hydromorphone Hcl) Anaphylaxis and Other (See Comments)    Mental, psychotic  . Morphine And Related Swelling    Of lips and tongue  . Tetanus Toxoids Other (See Comments)    fever  . Compazine (Prochlorperazine Edisylate) Other (See Comments)    Stiff neck    Medications Prior to Admission  Medication Sig Dispense Refill  . acetaminophen (TYLENOL) 500 MG tablet Take 1,000 mg by mouth every 6 (six) hours as needed. For pain      . amphetamine-dextroamphetamine (ADDERALL) 10 MG tablet Take 30 mg by mouth every morning.      Marland Kitchen azelastine (ASTELIN) 137 MCG/SPRAY nasal spray Place 2 sprays into the nose 2 (two) times daily as needed. For allergies      . busPIRone (BUSPAR) 15 MG tablet Take 15 mg by mouth 3 (three) times daily.      . Calcium-Magnesium-Zinc (CAL-MAG-ZINC PO) Take 1 tablet by mouth daily.       Marland Kitchen  clonazePAM (KLONOPIN) 1 MG tablet Take 3 mg by mouth at bedtime.      Marland Kitchen estrogens, conjugated, (PREMARIN) 0.3 MG tablet Take 0.3 mg by mouth daily.       Marland Kitchen HYDROcodone-acetaminophen (NORCO) 10-325 MG per tablet Take 1 tablet by mouth daily as needed. For pain      . Iron Polysacch Cmplx-B12-FA (FERREX 150 FORTE) 150-0.025-1 MG CAPS Take 150 mg by mouth daily.       . Meth-Hyo-M Bl-Na Phos-Ph Sal (URIBEL) 118 MG CAPS Take 118 mg by mouth as needed.       . metoprolol succinate (TOPROL-XL) 50 MG 24 hr tablet Take 50 mg by mouth daily. Take with or immediately following a meal.      . mirtazapine (REMERON) 30 MG tablet Take 30 mg by mouth at bedtime.      . Multiple Vitamins-Minerals (MULTIVITAMIN GUMMIES ADULT PO) Take 2 tablets by mouth daily.      . pentosan polysulfate (ELMIRON) 100 MG capsule Take 100 mg by mouth 2 (two) times daily. Two caps in the morning, two  caps at bedtime      . Pitavastatin Calcium (LIVALO) 4 MG TABS Take 4 mg by mouth daily.       . sertraline (ZOLOFT) 100 MG tablet Take 100 mg by mouth daily.      Marland Kitchen tiZANidine (ZANAFLEX) 2 MG tablet Take 2 mg by mouth every 6 (six) hours as needed. For muscle spasms      . zolmitriptan (ZOMIG) 5 MG tablet Take 5 mg by mouth as needed. For migraine        No results found for this or any previous visit (from the past 48 hour(s)). No results found.  A comprehensive review of systems was negative.  Blood pressure 108/81, pulse 64, temperature 98.3 F (36.8 C), temperature source Oral, resp. rate 18, SpO2 100.00%.  Patient is awake and alert she is oriented and appropriate. Cranial nerve function is intact. Motor is 5/5 in bilateral upper extremities. Motor is 5/5 in bilateral lower trimming his with increased tone. Sensory examination reveals relative sensory level at the T7 level more prominently on the right. Deep tendon versus are normal active in both opportunities. She is hyperreflexic in the lower.. Toes are upgoing to plantar stimulation. She has a few beats of clonus in both feet.  Examination head ears eyes nose and throat is unremarkable. Neck is supple. Airway midline. Chest clear to auscultation. Heart rhythm. Abdomen benign. Extremities free from injury or deformity. 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 Plan right transthoracic microdiscectomy at T7-8. Risks and benefits been explained. Patient wishes to proceed.  Abdirizak Richison A 10/30/2012, 7:42 AM

## 2012-10-30 NOTE — Anesthesia Preprocedure Evaluation (Signed)
Anesthesia Evaluation  Patient identified by MRN, date of birth, ID band Patient awake    Reviewed: Allergy & Precautions, H&P , NPO status , Patient's Chart, lab work & pertinent test results  History of Anesthesia Complications (+) PONV  Airway Mallampati: I TM Distance: >3 FB Neck ROM: Full    Dental   Pulmonary  breath sounds clear to auscultation        Cardiovascular Rhythm:Regular Rate:Normal     Neuro/Psych Anxiety Depression  Neuromuscular disease    GI/Hepatic   Endo/Other  Hypothyroidism   Renal/GU      Musculoskeletal   Abdominal   Peds  Hematology   Anesthesia Other Findings   Reproductive/Obstetrics                           Anesthesia Physical Anesthesia Plan  ASA: II  Anesthesia Plan: General   Post-op Pain Management:    Induction: Intravenous  Airway Management Planned: Double Lumen EBT  Additional Equipment: Arterial line  Intra-op Plan:   Post-operative Plan: Extubation in OR  Informed Consent: I have reviewed the patients History and Physical, chart, labs and discussed the procedure including the risks, benefits and alternatives for the proposed anesthesia with the patient or authorized representative who has indicated his/her understanding and acceptance.     Plan Discussed with: CRNA and Surgeon  Anesthesia Plan Comments:         Anesthesia Quick Evaluation

## 2012-10-30 NOTE — Brief Op Note (Signed)
10/30/2012  9:44 AM  PATIENT:  Clarisa Fling  61 y.o. female  PRE-OPERATIVE DIAGNOSIS:  HNP  POST-OPERATIVE DIAGNOSIS:  HNP  PROCEDURE:  Procedure(s) (LRB) with comments: THORACIC DISCECTOMY (Right) - Dr. Dorris Fetch to perform Right thoracotomy for exposure THORACIC EXPOSURE (Right)  SURGEON:  Surgeon(s) and Role: Panel 1:    * Temple Pacini, MD - Primary  Panel 2:    * Loreli Slot, MD - Primary  PHYSICIAN ASSISTANT:   ASSISTANTS: [place]   ANESTHESIA:   general  EBL:  Total I/O In: -  Out: 275 [Urine:225; Blood:50]  BLOOD ADMINISTERED:none  DRAINS: Blake Chest Tube(s) in the Right pleural space   LOCAL MEDICATIONS USED:  NONE  SPECIMEN:  No Specimen  DISPOSITION OF SPECIMEN:  N/A  COUNTS:  YES  TOURNIQUET:  * No tourniquets in log *  DICTATION: .Dragon Dictation  PLAN OF CARE: Admit to inpatient   PATIENT DISPOSITION:  PACU - hemodynamically stable.   Delay start of Pharmacological VTE agent (>24hrs) due to surgical blood loss or risk of bleeding: yes

## 2012-10-30 NOTE — OR Nursing (Signed)
Radiologist called in results from digital x-ray and with a limited field of view stated no foreign objects retained.

## 2012-10-30 NOTE — Anesthesia Postprocedure Evaluation (Signed)
  Anesthesia Post-op Note  Patient: Tiffany Villarreal  Procedure(s) Performed: Procedure(s) (LRB) with comments: THORACIC DISCECTOMY (Right) - Dr. Dorris Fetch to perform Right thoracotomy for exposure THORACIC EXPOSURE (Right)  Patient Location: PACU  Anesthesia Type:General  Level of Consciousness: awake  Airway and Oxygen Therapy: Patient Spontanous Breathing  Post-op Pain: mild  Post-op Assessment: Post-op Vital signs reviewed, Patient's Cardiovascular Status Stable, Respiratory Function Stable, Patent Airway, No signs of Nausea or vomiting and Pain level controlled  Post-op Vital Signs: stable  Complications: No apparent anesthesia complications

## 2012-10-30 NOTE — Transfer of Care (Signed)
Immediate Anesthesia Transfer of Care Note  Patient: Tiffany Villarreal  Procedure(s) Performed: Procedure(s) (LRB) with comments: THORACIC DISCECTOMY (Right) - Dr. Dorris Fetch to perform Right thoracotomy for exposure THORACIC EXPOSURE (Right)  Patient Location: PACU  Anesthesia Type:General  Level of Consciousness: awake, alert  and oriented  Airway & Oxygen Therapy: Patient Spontanous Breathing and Patient connected to face mask oxygen  Post-op Assessment: Report given to PACU RN, Post -op Vital signs reviewed and stable and Patient moving all extremities X 4  Post vital signs: Reviewed and stable  Complications: No apparent anesthesia complications

## 2012-10-30 NOTE — Op Note (Signed)
Date of procedure: 10/30/2012  Date of dictation: Same  Service: Neurosurgery  Preoperative diagnosis: T7-T8 herniated nucleus pulposus with myelopathy  Postoperative diagnosis: Same  Procedure Name: Right transthoracic microdiscectomy T7-8  Microdissection  Surgeon:Ender Rorke A.Keniah Klemmer, M.D.  Asst. Surgeon: Shirlean Kelly  Thoracic surgeon: Andrey Spearman  Anesthesia: General  Indication: 61 year old female with back and bilateral lower chamois pain paresthesias and weakness consistent with a thoracic myelopathy her workup demonstrates evidence of a large central disc herniation at T7-8 with marked spinal cord compression. Patient presented now for transthoracic microdiscectomy in hopes of improving her symptoms.  Operative note: After induction of anesthesia, patient positioned in the left lateral decubitus position. Right lung was deflated. Right chest wall prepped and draped sterilely. Dr. Dorris Fetch performed a right-sided thoracotomy through the sixth interspace. Retractor was placed. Lung was protected. Marker was placed into the T7-8 disc space. X-ray was taken level was confirmed. The pleura was removed from overlying the rib head of T8 and the disc space at T7-8. Rate head was removed using a high-speed drill and Kerrison rongeurs. The space and size 15 blade in a rectangular fashion. Posterior discectomies and performed using pituitary rongeurs and Epstein curettes high-speed drill and Kerrison rongeurs. This is carried down to the posterior longitudinal limb. Posterior lateral was elevated and resected in piecemeal fashion. Underlying thecal sac was identified. A moderately large amount of central disc herniation was encountered and completely resected. This point very thorough decompression of the thecal sac and underlying spinal cord had been achieved. Almost the disc herniation were completely resected. Pathology discovered during the discectomy was what was expected by the  preoperative MRI scan. This point a very thorough decompression achieved. Gelfoam was placed topically for hemostasis. Dr. Orson Aloe returned to the operative room and close the chest in a typical fashion. A chest tube was left in the pleural space. There were no apparent complications. Patient tolerated the procedure well and returned to recovery postop.

## 2012-10-31 ENCOUNTER — Inpatient Hospital Stay (HOSPITAL_COMMUNITY): Payer: PRIVATE HEALTH INSURANCE

## 2012-10-31 ENCOUNTER — Encounter (HOSPITAL_COMMUNITY): Payer: Self-pay | Admitting: Neurosurgery

## 2012-10-31 MED ORDER — HYDROCODONE-ACETAMINOPHEN 10-325 MG PO TABS
1.0000 | ORAL_TABLET | ORAL | Status: DC | PRN
  Administered 2012-11-01 (×2): 2 via ORAL
  Filled 2012-10-31 (×2): qty 2

## 2012-10-31 MED ORDER — KETOROLAC TROMETHAMINE 10 MG PO TABS
10.0000 mg | ORAL_TABLET | Freq: Two times a day (BID) | ORAL | Status: DC
  Administered 2012-10-31 – 2012-11-01 (×2): 10 mg via ORAL
  Filled 2012-10-31 (×3): qty 1

## 2012-10-31 MED ORDER — HYDROCODONE-ACETAMINOPHEN 10-325 MG PO TABS
1.0000 | ORAL_TABLET | ORAL | Status: DC | PRN
  Administered 2012-10-31 (×3): 2 via ORAL
  Filled 2012-10-31 (×3): qty 2

## 2012-10-31 MED ORDER — AMPHETAMINE-DEXTROAMPHETAMINE 20 MG PO TABS: 30.0000 mg | ORAL_TABLET | Freq: Every morning | ORAL | Status: DC

## 2012-10-31 NOTE — Op Note (Signed)
NAMEDEBORH, PENSE NO.:  [id number]  MEDICAL RECORD NO.:  [id number]  LOCATION:  3109                         FACILITY:  [facility]  PHYSICIAN:  Salvatore Decent. Dorris Fetch, M.D.DATE OF BIRTH:  [birth year]  DATE OF PROCEDURE: DATE OF DISCHARGE:                              OPERATIVE REPORT   PREOPERATIVE DIAGNOSIS:  Herniated disk, needs exposure for diskectomy.  POSTOPERATIVE DIAGNOSIS:  Herniated disk, needs exposure for diskectomy.  PROCEDURE:  Right thoracotomy for exposure of the thoracic spine.  SURGEON:  Salvatore Decent. Dorris Fetch, M.D.  ASSISTANT:  Julio Sicks, M.D.  ANESTHESIA:  General.  FINDINGS:  Normal chest wall anatomy.  CLINICAL NOTE:  Ms. Braid is a 61 year old woman who has a herniated disk at T7-T8 interspace.  She needs an anterior approach for diskectomy by Dr. Jordan Likes.  She requires a thoracotomy for exposure.  I discussed with the patient the operative approach including the incision to be used, need for chest tube postoperatively, and potential for postoperative pain due to the thoracotomy.  She understood these issues as well as those directly related to the diskectomy and agreed to proceed.  OPERATIVE NOTE:  Ms. Radziewicz was brought to the operating room on October 30, 2012.  She had induction of general anesthesia with placement of a double-lumen endotracheal tube.  She was placed in a left lateral decubitus position.  The right chest was prepped and draped in usual sterile fashion.  Single lung ventilation of the left lung was carried out and was tolerated well throughout the procedure.  A small right posterior-lateral thoracotomy was performed.  The skin and subcutaneous tissue were divided.  Hemostasis was achieved with electrocautery.  The latissimus muscle was divided with electrocautery.  The serratus muscle was spared, was mobilized from its lateral attachments and retracted anteriorly.  The chest was entered in the sixth interspace.   The seventh rib was divided to improve exposure.  Hemostasis was achieved.  Two laparotomy sponges were placed to protect the lung during the diskectomy.  There was good hemostasis.  Dr. Jordan Likes performed a microdiskectomy, please refer to the separately dictated note for details.  After completion of the diskectomy, a 28-French Blake drain was placed through a separate stab incision and secured with a #1 silk suture.  The laparotomy pads were removed.  A single #2 Vicryl pericostal figure-of- eight suture was used.  The right lung was reinflated.  The serratus muscle was reattached to its lateral attachments with running #1 Vicryl suture.  The latissimus fascia was closed with a running #1 Vicryl suture.  The subcutaneous tissue and skin were closed in standard fashion.  The chest tube was placed to suction.  The patient was extubated in the operating room and taken to postanesthetic care unit in good condition.     Salvatore Decent Dorris Fetch, M.D.     SCH/MEDQ  D:  10/30/2012  T:  10/31/2012  Job:  [id number]

## 2012-10-31 NOTE — Progress Notes (Signed)
UR completed 

## 2012-10-31 NOTE — Progress Notes (Signed)
Postop day 1. Patient notes incisional chest pain no other complaints of pain. Lower extremity is much improved.  She is afebrile. Her vitals are stable. Urine output is good. Followup chest x-ray demonstrates no evidence of pneumothorax. Chest tube output very low. Neurologically she is awake and alert. Oriented and appropriate. Motor and sensory function of the extremities is normal. Dressing clean and dry.  Doing well following thoracic microdiscectomy. Mobilize. Transfer to floor. Chest tube management per Dr. Dorris Fetch.

## 2012-10-31 NOTE — Progress Notes (Addendum)
[address]            [place] [address]          [phone]     1 Day Post-Op Procedure(s) (LRB): THORACIC DISCECTOMY (Right) THORACIC EXPOSURE (Right)  Subjective: Comfortable, no complaints.  Breathing stable.   Objective: Vital signs in last 24 hours: Patient Vitals for the past 24 hrs:  BP Temp Temp src Pulse Resp SpO2 Height Weight  10/31/12 0800 98/47 mmHg - - 71  14  95 % - -  10/31/12 0700 90/50 mmHg - - 84  15  96 % - -  10/31/12 0600 114/53 mmHg - - 64  16  100 % - -  10/31/12 0500 102/55 mmHg - - 69  15  99 % - -  10/31/12 0400 106/56 mmHg 98 F (36.7 C) Axillary 67  15  97 % - -  10/31/12 0300 104/53 mmHg - - 72  17  97 % - -  10/31/12 0200 98/63 mmHg - - 73  14  98 % - -  10/31/12 0100 103/51 mmHg - - 70  19  98 % - -  10/31/12 0000 103/51 mmHg 97.8 F (36.6 C) Oral 79  20  95 % - -  10/30/12 2300 96/46 mmHg - - 82  17  96 % - -  10/30/12 2200 117/69 mmHg - - 72  10  95 % - -  10/30/12 2100 112/68 mmHg - - 70  12  95 % - -  10/30/12 2000 105/51 mmHg 98.6 F (37 C) Oral 77  11  96 % - -  10/30/12 1900 98/53 mmHg - - 75  12  94 % - -  10/30/12 1800 91/47 mmHg - - 74  17  97 % - -  10/30/12 1700 126/71 mmHg - - 64  17  100 % - -  10/30/12 1600 99/59 mmHg 97.6 F (36.4 C) Oral 80  12  97 % - -  10/30/12 1500 106/66 mmHg - - 59  17  100 % - -  10/30/12 1400 106/64 mmHg - - 78  11  97 % - -  10/30/12 1300 108/62 mmHg - - 75  13  95 % - -  10/30/12 1200 116/65 mmHg 97.6 F (36.4 C) Oral 66  16  100 % 5\' 3"  (1.6 m) 143 lb 8.3 oz (65.1 kg)  10/30/12 1115 113/62 mmHg - - 64  16  100 % - -  10/30/12 1100 121/68 mmHg - - 67  14  100 % - -  10/30/12 1045 116/69 mmHg - - 57  18  100 % - -  10/30/12 1030 107/75 mmHg - - 66  17  100 % - -  10/30/12 1015 113/69 mmHg 96.8 F (36 C) - 64  17  100 % - -   Current Weight  10/30/12 143 lb 8.3 oz (65.1 kg)     Intake/Output from previous day: 12/16 0701 - 12/17 0700 In: 2900  [I.V.:2800; IV Piggyback:100] Out: 1775 [Urine:1525; Blood:50; Chest Tube:200]    PHYSICAL EXAM:  Heart: RRR Lungs: Clear Wound:dressed and dry Chest tube: small amount of tidaling, but no obvious air leak    Lab Results: CBC:No results found for this basename: WBC:2,HGB:2,HCT:2,PLT:2 in the last 72 hours BMET: No results found for this basename: NA:2,K:2,CL:2,CO2:2,GLUCOSE:2,BUN:2,CREATININE:2,CALCIUM:2 in the last  72 hours  PT/INR: No results found for this basename: LABPROT,INR in the last 72 hours  CXR: Findings: Right chest tube remains in place. No pneumothorax  identified. Improved aeration of both lungs is seen. Decreased  left basilar atelectasis is noted. No evidence of pulmonary  consolidation or edema. Heart size is within normal limits.  IMPRESSION:  Improved aeration with decreased left basilar atelectasis. No  pneumothorax identified.   Assessment/Plan: S/P Procedure(s) (LRB): THORACIC DISCECTOMY (Right) THORACIC EXPOSURE (Right) Stable from thoracic standpoint. Continue CT for now.   Mobilize as tolerated- per Dr. Jordan Likes.    LOS: 1 day    Maready,GINA H 10/31/2012  Some chest incisional discomfort earlier, feels better now. She's very excited that her toes are no longer numb Ct with minimal serous drainage, no air leak- will d/c CT Ok with me to transfer

## 2012-11-01 ENCOUNTER — Inpatient Hospital Stay (HOSPITAL_COMMUNITY): Payer: PRIVATE HEALTH INSURANCE

## 2012-11-01 MED ORDER — KETOROLAC TROMETHAMINE 10 MG PO TABS: 10.0000 mg | ORAL_TABLET | Freq: Two times a day (BID) | ORAL | Status: DC

## 2012-11-01 MED ORDER — HYDROCODONE-ACETAMINOPHEN 10-325 MG PO TABS: 1.0000 | ORAL_TABLET | ORAL | Status: DC | PRN

## 2012-11-01 MED ORDER — METOPROLOL SUCCINATE ER 50 MG PO TB24
50.0000 mg | ORAL_TABLET | Freq: Every day | ORAL | Status: DC
  Administered 2012-11-01: 50 mg via ORAL
  Filled 2012-11-01 (×3): qty 1

## 2012-11-01 MED ORDER — TIZANIDINE HCL 2 MG PO TABS: 2.0000 mg | ORAL_TABLET | Freq: Four times a day (QID) | ORAL | Status: DC | PRN

## 2012-11-01 NOTE — Discharge Summary (Signed)
Physician Discharge Summary  Patient ID: Tiffany Villarreal MRN: [id number] DOB/AGE: 1950-11-16 61 y.o.  Admit date: 10/30/2012 Discharge date: 11/01/2012  Admission Diagnoses:  Discharge Diagnoses:  Principal Problem:  *Herniated nucleus pulposus with myelopathy, thoracic   Discharged Condition: good  Hospital Course: Patient admitted to the hospital where she underwent uncomplicated transthoracic microdiscectomy. Postoperative she is done very well. Preoperative symptoms of myelopathy have resolved. Strength and sensation are normal. Wound healing well. Chest tube removed on her first postoperative day without difficulty. Patient has mobilized without difficulty and is ready for discharge home.  Consults:   Significant Diagnostic Studies:   Treatments:   Discharge Exam: Blood pressure 95/60, pulse 75, temperature 98.6 F (37 C), temperature source Oral, resp. rate 16, height 5' 3.5" (1.613 m), weight 66.452 kg (146 lb 8 oz), SpO2 96.00%. She is awake alert oriented and appropriate. Cranial nerve function is intact. Motor and sensory function intact. Wound clean and dry. Chest clear to auscultation. Heart regular in rhythm. Abdomen benign.  Disposition: Final discharge disposition not confirmed     Medication List     As of 11/01/2012  9:04 AM    TAKE these medications         acetaminophen 500 MG tablet   Commonly known as: TYLENOL   Take 1,000 mg by mouth every 6 (six) hours as needed. For pain      amphetamine-dextroamphetamine 10 MG tablet   Commonly known as: ADDERALL   Take 30 mg by mouth every morning.      azelastine 137 MCG/SPRAY nasal spray   Commonly known as: ASTELIN   Place 2 sprays into the nose 2 (two) times daily as needed. For allergies      busPIRone 15 MG tablet   Commonly known as: BUSPAR   Take 15 mg by mouth 3 (three) times daily.      CAL-MAG-ZINC PO   Take 1 tablet by mouth daily.      clonazePAM 1 MG tablet   Commonly known as:  KLONOPIN   Take 3 mg by mouth at bedtime.      estrogens (conjugated) 0.3 MG tablet   Commonly known as: PREMARIN   Take 0.3 mg by mouth daily.      FERREX 150 FORTE 150-0.025-1 MG Caps   Generic drug: Iron Polysacch Cmplx-B12-FA   Take 150 mg by mouth daily.      HYDROcodone-acetaminophen 10-325 MG per tablet   Commonly known as: NORCO   Take 1-2 tablets by mouth every 4 (four) hours as needed for pain. For pain      ketorolac 10 MG tablet   Commonly known as: TORADOL   Take 1 tablet (10 mg total) by mouth 2 (two) times daily.      LIVALO 4 MG Tabs   Generic drug: Pitavastatin Calcium   Take 4 mg by mouth daily.      metoprolol succinate 50 MG 24 hr tablet   Commonly known as: TOPROL-XL   Take 50 mg by mouth daily. Take with or immediately following a meal.      mirtazapine 30 MG tablet   Commonly known as: REMERON   Take 30 mg by mouth at bedtime.      MULTIVITAMIN GUMMIES ADULT PO   Take 2 tablets by mouth daily.      pentosan polysulfate 100 MG capsule   Commonly known as: ELMIRON   Take 100 mg by mouth 2 (two) times daily. Two caps in the morning, two caps at  bedtime      sertraline 100 MG tablet   Commonly known as: ZOLOFT   Take 100 mg by mouth daily.      tiZANidine 2 MG tablet   Commonly known as: ZANAFLEX   Take 1 tablet (2 mg total) by mouth every 6 (six) hours as needed. For muscle spasms      URIBEL 118 MG Caps   Take 118 mg by mouth as needed.      zolmitriptan 5 MG tablet   Commonly known as: ZOMIG   Take 5 mg by mouth as needed. For migraine           Follow-up Information    Follow up with Ricardo Schubach A, MD. Call in 1 week. (Ask for Lurena Joiner)    Contact information:   [address] [place] [address] [phone]          Signed: Marlinda Miranda A 11/01/2012, 9:04 AM

## 2012-11-01 NOTE — Progress Notes (Signed)
Feels better this AM  Some incisional pain  Wound intact  Mild serous drainage from CT site  [facility] for d/c from my standpoint

## 2012-11-08 ENCOUNTER — Emergency Department (HOSPITAL_BASED_OUTPATIENT_CLINIC_OR_DEPARTMENT_OTHER)
Admission: EM | Admit: 2012-11-08 | Discharge: 2012-11-08 | Disposition: A | Discharge status: Home / Self Care | Payer: PRIVATE HEALTH INSURANCE | Attending: Emergency Medicine | Admitting: Emergency Medicine

## 2012-11-08 ENCOUNTER — Emergency Department (HOSPITAL_BASED_OUTPATIENT_CLINIC_OR_DEPARTMENT_OTHER): Payer: PRIVATE HEALTH INSURANCE

## 2012-11-08 ENCOUNTER — Encounter (HOSPITAL_BASED_OUTPATIENT_CLINIC_OR_DEPARTMENT_OTHER): Payer: Self-pay | Admitting: *Deleted

## 2012-11-08 ENCOUNTER — Encounter (HOSPITAL_BASED_OUTPATIENT_CLINIC_OR_DEPARTMENT_OTHER): Payer: Self-pay | Admitting: Emergency Medicine

## 2012-11-08 DIAGNOSIS — E785 Hyperlipidemia, unspecified: Secondary | ICD-10-CM | POA: Insufficient documentation

## 2012-11-08 DIAGNOSIS — M546 Pain in thoracic spine: Secondary | ICD-10-CM | POA: Insufficient documentation

## 2012-11-08 DIAGNOSIS — Z8701 Personal history of pneumonia (recurrent): Secondary | ICD-10-CM | POA: Insufficient documentation

## 2012-11-08 DIAGNOSIS — Z9889 Other specified postprocedural states: Secondary | ICD-10-CM | POA: Insufficient documentation

## 2012-11-08 DIAGNOSIS — Z8679 Personal history of other diseases of the circulatory system: Secondary | ICD-10-CM | POA: Insufficient documentation

## 2012-11-08 DIAGNOSIS — E039 Hypothyroidism, unspecified: Secondary | ICD-10-CM | POA: Insufficient documentation

## 2012-11-08 DIAGNOSIS — Z8739 Personal history of other diseases of the musculoskeletal system and connective tissue: Secondary | ICD-10-CM | POA: Insufficient documentation

## 2012-11-08 DIAGNOSIS — Z79899 Other long term (current) drug therapy: Secondary | ICD-10-CM | POA: Insufficient documentation

## 2012-11-08 DIAGNOSIS — J189 Pneumonia, unspecified organism: Secondary | ICD-10-CM | POA: Insufficient documentation

## 2012-11-08 DIAGNOSIS — Z8709 Personal history of other diseases of the respiratory system: Secondary | ICD-10-CM | POA: Insufficient documentation

## 2012-11-08 DIAGNOSIS — Z860100 Personal history of colon polyps, unspecified: Secondary | ICD-10-CM | POA: Insufficient documentation

## 2012-11-08 DIAGNOSIS — G8918 Other acute postprocedural pain: Secondary | ICD-10-CM | POA: Insufficient documentation

## 2012-11-08 DIAGNOSIS — N3010 Interstitial cystitis (chronic) without hematuria: Secondary | ICD-10-CM | POA: Insufficient documentation

## 2012-11-08 DIAGNOSIS — Z8639 Personal history of other endocrine, nutritional and metabolic disease: Secondary | ICD-10-CM | POA: Insufficient documentation

## 2012-11-08 DIAGNOSIS — F329 Major depressive disorder, single episode, unspecified: Secondary | ICD-10-CM | POA: Insufficient documentation

## 2012-11-08 DIAGNOSIS — G4730 Sleep apnea, unspecified: Secondary | ICD-10-CM | POA: Insufficient documentation

## 2012-11-08 DIAGNOSIS — F411 Generalized anxiety disorder: Secondary | ICD-10-CM | POA: Insufficient documentation

## 2012-11-08 DIAGNOSIS — Z87448 Personal history of other diseases of urinary system: Secondary | ICD-10-CM | POA: Insufficient documentation

## 2012-11-08 DIAGNOSIS — G8929 Other chronic pain: Secondary | ICD-10-CM | POA: Insufficient documentation

## 2012-11-08 DIAGNOSIS — M1990 Unspecified osteoarthritis, unspecified site: Secondary | ICD-10-CM | POA: Insufficient documentation

## 2012-11-08 DIAGNOSIS — Z9989 Dependence on other enabling machines and devices: Secondary | ICD-10-CM | POA: Insufficient documentation

## 2012-11-08 DIAGNOSIS — D649 Anemia, unspecified: Secondary | ICD-10-CM | POA: Insufficient documentation

## 2012-11-08 DIAGNOSIS — M129 Arthropathy, unspecified: Secondary | ICD-10-CM | POA: Insufficient documentation

## 2012-11-08 DIAGNOSIS — R071 Chest pain on breathing: Secondary | ICD-10-CM | POA: Insufficient documentation

## 2012-11-08 DIAGNOSIS — F3289 Other specified depressive episodes: Secondary | ICD-10-CM | POA: Insufficient documentation

## 2012-11-08 DIAGNOSIS — M549 Dorsalgia, unspecified: Secondary | ICD-10-CM

## 2012-11-08 DIAGNOSIS — I499 Cardiac arrhythmia, unspecified: Secondary | ICD-10-CM | POA: Insufficient documentation

## 2012-11-08 DIAGNOSIS — Z862 Personal history of diseases of the blood and blood-forming organs and certain disorders involving the immune mechanism: Secondary | ICD-10-CM | POA: Insufficient documentation

## 2012-11-08 DIAGNOSIS — Z8719 Personal history of other diseases of the digestive system: Secondary | ICD-10-CM | POA: Insufficient documentation

## 2012-11-08 LAB — CBC WITH DIFFERENTIAL/PLATELET
Band Neutrophils: 1 % (ref 0–10)
Hemoglobin: 11.7 g/dL — ABNORMAL LOW (ref 12.0–15.0)
Lymphs Abs: 1.5 10*3/uL (ref 0.7–4.0)
Metamyelocytes Relative: 1 %
Neutro Abs: 6.5 10*3/uL (ref 1.7–7.7)
Neutrophils Relative %: 70 % (ref 43–77)
Platelets: 363 10*3/uL (ref 150–400)
RBC: 3.91 MIL/uL (ref 3.87–5.11)
WBC: 8.8 10*3/uL (ref 4.0–10.5)

## 2012-11-08 LAB — BASIC METABOLIC PANEL
BUN: 11 mg/dL (ref 6–23)
CO2: 28 mEq/L (ref 19–32)
Chloride: 100 mEq/L (ref 96–112)
Creatinine, Ser: 0.8 mg/dL (ref 0.50–1.10)
Potassium: 3.7 mEq/L (ref 3.5–5.1)

## 2012-11-08 MED ORDER — SODIUM CHLORIDE 0.9 % IV SOLN
INTRAVENOUS | Status: DC
  Administered 2012-11-08: 125 mL/h via INTRAVENOUS

## 2012-11-08 MED ORDER — DIAZEPAM 5 MG PO TABS
5.0000 mg | ORAL_TABLET | Freq: Once | ORAL | Status: AC
  Administered 2012-11-08: 5 mg via ORAL
  Filled 2012-11-08: qty 1

## 2012-11-08 MED ORDER — FENTANYL CITRATE 0.05 MG/ML IJ SOLN
50.0000 ug | Freq: Once | INTRAMUSCULAR | Status: AC
  Administered 2012-11-08: 50 ug via INTRAVENOUS
  Filled 2012-11-08: qty 2

## 2012-11-08 MED ORDER — FENTANYL 25 MCG/HR TD PT72: MEDICATED_PATCH | TRANSDERMAL | Status: DC

## 2012-11-08 MED ORDER — DIAZEPAM 5 MG PO TABS: 5.0000 mg | ORAL_TABLET | Freq: Two times a day (BID) | ORAL | Status: DC

## 2012-11-08 MED ORDER — FENTANYL CITRATE 0.05 MG/ML IJ SOLN
INTRAMUSCULAR | Status: AC
  Administered 2012-11-08: 100 ug via INTRAVENOUS
  Filled 2012-11-08: qty 2

## 2012-11-08 MED ORDER — LEVOFLOXACIN 750 MG PO TABS
750.0000 mg | ORAL_TABLET | Freq: Once | ORAL | Status: AC
  Administered 2012-11-08: 750 mg via ORAL
  Filled 2012-11-08: qty 1

## 2012-11-08 MED ORDER — HYDROCODONE-ACETAMINOPHEN 5-325 MG PO TABS
ORAL_TABLET | ORAL | Status: AC
  Filled 2012-11-08: qty 2

## 2012-11-08 MED ORDER — FENTANYL CITRATE 0.05 MG/ML IJ SOLN
100.0000 ug | Freq: Once | INTRAMUSCULAR | Status: AC
  Administered 2012-11-08: 100 ug via INTRAVENOUS

## 2012-11-08 MED ORDER — LEVOFLOXACIN 750 MG PO TABS: ORAL_TABLET | ORAL | Status: DC

## 2012-11-08 NOTE — ED Provider Notes (Signed)
History     CSN: [id number]  Arrival date & time 11/08/12  1352   First MD Initiated Contact with Patient 11/08/12 1406      Chief Complaint  Patient presents with  . Flank Pain    (Consider location/radiation/quality/duration/timing/severity/associated sxs/prior treatment) HPI Comments: is a 61 year old female who had a transthoracic procedure on her T9 vertebra on the 16th of this month. She was discharged home on the 18th of this month. Since that time she has had right chest and back pain not adequately relieved by her prescribed hydrocodone. This morning her pain became so severe she was having difficulty breathing.  She continues to have pain in her right chest and back she is now able to breathe freely. Her pain was initially severe but is now moderate. It is exacerbated by movement, palpation or breathing. She states she had been off balance prior to her surgery, and was expecting improvement with this, but since her surgery she has had 2 walk with the assistance of a walker. She states she used Lamaze breathing techniques to control her pain and dyspnea prior to arrival.She was seen in the er this morning and had a ct scan and was given antibiotics for possible pneumonia and fentanyl patches for pain and she is back because she states that it is not helping  The history is provided by the patient. No language interpreter was used.    Past Medical History  Diagnosis Date  . Back pain   . Interstitial cystitis     takes Uribel and Elmiron bid   . Dysrhythmia     takes Metoprolol daily  . Hyperlipidemia     takes Livalo daily  . Sleep apnea     uses CPAP  . Pneumonia     last time a few yrs ago  . History of bronchitis     recently and was on and has completed an antibiotic  . History of migraine     last one a few wks ago and takes ZOmig prn  . Dizziness     related spine   . Arthritis   . Peripheral edema   . Chronic back pain   . Diarrhea     since stomach  surgeries  . History of colon polyps   . Urinary frequency   . Urinary urgency   . Muscle spasms of head and/or neck     takes Zanaflex prn  . Osteoarthritis   . History of blood transfusion     no abnormal reaction noted  . Hypothyroidism     but doesn't require meds  . Anemia     takes Iron caps daily  . Depression     takes Buspar,Klonopin,and Sertraline daily  . Anxiety     takes Remeron and Adderall  . PONV (postoperative nausea and vomiting)     extreme vomiting;pt states she had to have a picc line  after 2 surgeries    Past Surgical History  Procedure Date  . Laparoscopic gastrotomy w/ repair of ulcer   . Ceasarian   . Cholecystectomy 2010  . Bladder tumor removed 2002    benign  . Abdominal hysterectomy 1988  . Appendectomy 1970  . Tonsillectomy 1969  . Colonoscopy   . Esophagogastroduodenoscopy   . Thoracic discectomy 10/30/2012    Procedure: THORACIC DISCECTOMY;  Surgeon: Temple Pacini, MD;  Location: [facility];  Service: Neurosurgery;  Laterality: Right;  Dr. Dorris Fetch to perform Right thoracotomy for exposure  .  Thoracic exposure 10/30/2012    Procedure: THORACIC EXPOSURE;  Surgeon: Loreli Slot, MD;  Location: [facility];  Service: Thoracic;  Laterality: Right;  Thoracic Exposure    History reviewed. No pertinent family history.  History  Substance Use Topics  . Smoking status: Never Smoker   . Smokeless tobacco: Never Used  . Alcohol Use: No    OB History    Grav Para Term Preterm Abortions TAB SAB Ect Mult Living                  Review of Systems  Constitutional: Negative.   Respiratory: Negative.   Cardiovascular: Negative.     Allergies  Dilaudid; Morphine and related; Tetanus toxoids; and Compazine  Home Medications   Current Outpatient Rx  Name  Route  Sig  Dispense  Refill  . ACETAMINOPHEN 500 MG PO TABS   Oral   Take 1,000 mg by mouth every 6 (six) hours as needed. For pain         .  AMPHETAMINE-DEXTROAMPHETAMINE 10 MG PO TABS   Oral   Take 30 mg by mouth every morning.         . AZELASTINE HCL 137 MCG/SPRAY NA SOLN   Nasal   Place 2 sprays into the nose 2 (two) times daily as needed. For allergies         . BUSPIRONE HCL 15 MG PO TABS   Oral   Take 15 mg by mouth 3 (three) times daily.         Marland Kitchen CAL-MAG-ZINC PO   Oral   Take 1 tablet by mouth daily.          Marland Kitchen CLONAZEPAM 1 MG PO TABS   Oral   Take 3 mg by mouth at bedtime.         . ESTROGENS CONJUGATED 0.3 MG PO TABS   Oral   Take 0.3 mg by mouth daily.          . FENTANYL 25 MCG/HR TD PT72      Place patch on skin and leave in place for 3 days. Replace after 3 days; remove old patch when applying new patch.   5 patch   0   . HYDROCODONE-ACETAMINOPHEN 10-325 MG PO TABS   Oral   Take 1-2 tablets by mouth every 4 (four) hours as needed for pain. For pain   90 tablet   0   . IRON POLYSACCH CMPLX-B12-FA 150-0.025-1 MG PO CAPS   Oral   Take 150 mg by mouth daily.          Marland Kitchen KETOROLAC TROMETHAMINE 10 MG PO TABS   Oral   Take 1 tablet (10 mg total) by mouth 2 (two) times daily.   20 tablet   0   . LEVOFLOXACIN 750 MG PO TABS      Take 1 tablet daily starting Thursday, 11/09/2012.   6 tablet   0   . URIBEL 118 MG PO CAPS   Oral   Take 118 mg by mouth as needed.          Marland Kitchen METOPROLOL SUCCINATE ER 50 MG PO TB24   Oral   Take 50 mg by mouth daily. Take with or immediately following a meal.         . MIRTAZAPINE 30 MG PO TABS   Oral   Take 30 mg by mouth at bedtime.         . MULTIVITAMIN GUMMIES ADULT PO  Oral   Take 2 tablets by mouth daily.         Marland Kitchen PENTOSAN POLYSULFATE SODIUM 100 MG PO CAPS   Oral   Take 100 mg by mouth 2 (two) times daily. Two caps in the morning, two caps at bedtime         . PITAVASTATIN CALCIUM 4 MG PO TABS   Oral   Take 4 mg by mouth daily.          . SERTRALINE HCL 100 MG PO TABS   Oral   Take 100 mg by mouth daily.          Marland Kitchen TIZANIDINE HCL 2 MG PO TABS   Oral   Take 1 tablet (2 mg total) by mouth every 6 (six) hours as needed. For muscle spasms   50 tablet   0   . ZOLMITRIPTAN 5 MG PO TABS   Oral   Take 5 mg by mouth as needed. For migraine           BP 140/83  Pulse 85  Temp 98.6 F (37 C) (Oral)  Resp 16  SpO2 95%  Physical Exam  Nursing note and vitals reviewed. Constitutional: She is oriented to person, place, and time. She appears well-developed and well-nourished.  HENT:  Head: Normocephalic and atraumatic.  Eyes: Conjunctivae normal and EOM are normal. Pupils are equal, round, and reactive to light.  Neck: Normal range of motion. Neck supple.  Cardiovascular: Normal rate and regular rhythm.   Pulmonary/Chest:       Right lateral chest wall tender to palpation:well healing surgical wound  Musculoskeletal: Normal range of motion.  Neurological: She is alert and oriented to person, place, and time.  Skin: Skin is warm and dry.    ED Course  Procedures (including critical care time)  Labs Reviewed  CBC WITH DIFFERENTIAL - Abnormal; Notable for the following:    Hemoglobin 11.7 (*)     HCT 35.3 (*)     All other components within normal limits  BASIC METABOLIC PANEL - Abnormal; Notable for the following:    GFR calc non Af Amer 79 (*)     All other components within normal limits   Ct Chest Wo Contrast  11/08/2012  *RADIOLOGY REPORT*  Clinical Data: History of transthoracic discectomy, postoperative back pain  CT CHEST WITHOUT CONTRAST  Technique:  Multidetector CT imaging of the chest was performed following the standard protocol without IV contrast.  Comparison: Portable chest x-ray of 11/01/2012  Findings: There are small to moderate-sized bilateral pleural effusions present.  Also there is opacity within the superior segment of the right lower lobe and in the left lower lobe consistent with pneumonia.  Fluid tracks into the right major and minor fissures creating a  pseudotumor appearance.  No pneumothorax is seen.  The central airway is patent.  There is irregularity of the superior endplate of T9 vertebral body posteriorly and possibly to a lesser degree of the inferior endplate of T8 vertebral body posteriorly.  These defects may be postoperative in nature, with a small amount of air is present and infection cannot be excluded.  A sclerotic lesion is noted and T10 vertebral body.  On soft tissue window images, the thyroid gland is unremarkable.  A few mediastinal nodes are present none of which are pathologically enlarged currently.  The heart is within normal limits in size.  No abnormality of the upper abdomen is seen.  IMPRESSION:  1.  Bilateral pleural effusions right  greater than left. 2.  Parenchymal opacities in the superior segment of the right lower lobe and within the left lower lobe suspicious for pneumonia with air bronchograms present. 3.  Abnormality of the inferior endplate of T8 and superior endplate of T9 may be postoperative, but infection cannot be excluded. 4.  Sclerotic lesion in T10.  Question significance.   Original Report Authenticated By: Dwyane Dee, M.D.      1. Back pain       MDM  Spoke with Dr. Gerlene Fee who recommended valium:pt is more comfortable at this time        Teressa Lower, NP 11/08/12 1612

## 2012-11-08 NOTE — ED Notes (Signed)
Pt care assumed, report received at this time. Pt resting quietly, reports pain is "much better" since last dose of fentanyl.

## 2012-11-08 NOTE — ED Provider Notes (Signed)
History     CSN: [id number]  Arrival date & time 11/08/12  7829   First MD Initiated Contact with Patient 11/08/12 [phone]      Chief Complaint  Patient presents with  . Chest Pain    (Consider location/radiation/quality/duration/timing/severity/associated sxs/prior treatment) HPI  is a 61 year old female who had a transthoracic procedure on her T9 vertebra on the 16th of this month. She was discharged home on the 18th of this month. Since that time she has had right chest and back pain not adequately relieved by her prescribed hydrocodone. This morning her pain became so severe she was having difficulty breathing. EMS was called and they found her to have an oxygen saturation 92% on room air and in significant discomfort. In the process of sliding or over to their gurney she felt a pop in her right chest with significant relief of the pain and dyspnea. She continues to have pain in her right chest and back she is now able to breathe freely. Her pain was initially severe but is now moderate. It is exacerbated by movement, palpation or breathing. She states she had been off balance prior to her surgery, and was expecting improvement with this, but since her surgery she has had 2 walk with the assistance of a walker. She states she used Lamaze breathing techniques to control her pain and dyspnea prior to arrival.  Past Medical History  Diagnosis Date  . Back pain   . Interstitial cystitis     takes Uribel and Elmiron bid   . Dysrhythmia     takes Metoprolol daily  . Hyperlipidemia     takes Livalo daily  . Sleep apnea     uses CPAP  . Pneumonia     last time a few yrs ago  . History of bronchitis     recently and was on and has completed an antibiotic  . History of migraine     last one a few wks ago and takes ZOmig prn  . Dizziness     related spine   . Arthritis   . Peripheral edema   . Chronic back pain   . Diarrhea     since stomach surgeries  . History of colon polyps   .  Urinary frequency   . Urinary urgency   . Muscle spasms of head and/or neck     takes Zanaflex prn  . Osteoarthritis   . History of blood transfusion     no abnormal reaction noted  . Hypothyroidism     but doesn't require meds  . Anemia     takes Iron caps daily  . Depression     takes Buspar,Klonopin,and Sertraline daily  . Anxiety     takes Remeron and Adderall  . PONV (postoperative nausea and vomiting)     extreme vomiting;pt states she had to have a picc line  after 2 surgeries    Past Surgical History  Procedure Date  . Laparoscopic gastrotomy w/ repair of ulcer   . Ceasarian   . Cholecystectomy 2010  . Bladder tumor removed 2002    benign  . Abdominal hysterectomy 1988  . Appendectomy 1970  . Tonsillectomy 1969  . Colonoscopy   . Esophagogastroduodenoscopy   . Thoracic discectomy 10/30/2012    Procedure: THORACIC DISCECTOMY;  Surgeon: Temple Pacini, MD;  Location: [facility];  Service: Neurosurgery;  Laterality: Right;  Dr. Dorris Fetch to perform Right thoracotomy for exposure  . Thoracic exposure 10/30/2012  Procedure: THORACIC EXPOSURE;  Surgeon: Loreli Slot, MD;  Location: [facility];  Service: Thoracic;  Laterality: Right;  Thoracic Exposure    No family history on file.  History  Substance Use Topics  . Smoking status: Never Smoker   . Smokeless tobacco: Never Used  . Alcohol Use: No    OB History    Grav Para Term Preterm Abortions TAB SAB Ect Mult Living                  Review of Systems  All other systems reviewed and are negative.    Allergies  Dilaudid; Morphine and related; Tetanus toxoids; and Compazine  Home Medications   Current Outpatient Rx  Name  Route  Sig  Dispense  Refill  . ACETAMINOPHEN 500 MG PO TABS   Oral   Take 1,000 mg by mouth every 6 (six) hours as needed. For pain         . AMPHETAMINE-DEXTROAMPHETAMINE 10 MG PO TABS   Oral   Take 30 mg by mouth every morning.         . AZELASTINE HCL 137  MCG/SPRAY NA SOLN   Nasal   Place 2 sprays into the nose 2 (two) times daily as needed. For allergies         . BUSPIRONE HCL 15 MG PO TABS   Oral   Take 15 mg by mouth 3 (three) times daily.         Marland Kitchen CAL-MAG-ZINC PO   Oral   Take 1 tablet by mouth daily.          Marland Kitchen CLONAZEPAM 1 MG PO TABS   Oral   Take 3 mg by mouth at bedtime.         . ESTROGENS CONJUGATED 0.3 MG PO TABS   Oral   Take 0.3 mg by mouth daily.          Marland Kitchen HYDROCODONE-ACETAMINOPHEN 10-325 MG PO TABS   Oral   Take 1-2 tablets by mouth every 4 (four) hours as needed for pain. For pain   90 tablet   0   . IRON POLYSACCH CMPLX-B12-FA 150-0.025-1 MG PO CAPS   Oral   Take 150 mg by mouth daily.          Marland Kitchen KETOROLAC TROMETHAMINE 10 MG PO TABS   Oral   Take 1 tablet (10 mg total) by mouth 2 (two) times daily.   20 tablet   0   . URIBEL 118 MG PO CAPS   Oral   Take 118 mg by mouth as needed.          Marland Kitchen METOPROLOL SUCCINATE ER 50 MG PO TB24   Oral   Take 50 mg by mouth daily. Take with or immediately following a meal.         . MIRTAZAPINE 30 MG PO TABS   Oral   Take 30 mg by mouth at bedtime.         . MULTIVITAMIN GUMMIES ADULT PO   Oral   Take 2 tablets by mouth daily.         Marland Kitchen PENTOSAN POLYSULFATE SODIUM 100 MG PO CAPS   Oral   Take 100 mg by mouth 2 (two) times daily. Two caps in the morning, two caps at bedtime         . PITAVASTATIN CALCIUM 4 MG PO TABS   Oral   Take 4 mg by mouth daily.          Marland Kitchen  SERTRALINE HCL 100 MG PO TABS   Oral   Take 100 mg by mouth daily.         Marland Kitchen TIZANIDINE HCL 2 MG PO TABS   Oral   Take 1 tablet (2 mg total) by mouth every 6 (six) hours as needed. For muscle spasms   50 tablet   0   . ZOLMITRIPTAN 5 MG PO TABS   Oral   Take 5 mg by mouth as needed. For migraine           BP 130/103  Pulse 80  Temp 99.5 F (37.5 C) (Oral)  Resp 20  Ht 5' 3.5" (1.613 m)  Wt 133 lb (60.328 kg)  BMI 23.19 kg/m2  SpO2 97%  Physical  Exam General: Well-developed, well-nourished female in no acute distress; appearance consistent with age of record HENT: normocephalic, atraumatic Eyes: pupils equal round and reactive to light; extraocular muscles intact Neck: supple Heart: regular rate and rhythm Lungs: clear to auscultation bilaterally Chest: Well healing surgical incision right lateral chest just below the axilla, some tenderness to palpation Abdomen: soft; nondistended; nontender; bowel sounds present Extremities: No deformity; full range of motion; pulses normal Neurologic: Awake, alert and oriented; motor function intact in all extremities and symmetric; no facial droop Skin: Warm and dry Psychiatric: Normal mood and affect    ED Course  Procedures (including critical care time)     MDM  Nursing notes and vitals signs, including pulse oximetry, reviewed.  Summary of this visit's results, reviewed by myself:  Labs:  No results found for this or any previous visit (from the past 24 hour(s)).  Imaging Studies: Ct Chest Wo Contrast  11/08/2012  *RADIOLOGY REPORT*  Clinical Data: History of transthoracic discectomy, postoperative back pain  CT CHEST WITHOUT CONTRAST  Technique:  Multidetector CT imaging of the chest was performed following the standard protocol without IV contrast.  Comparison: Portable chest x-ray of 11/01/2012  Findings: There are small to moderate-sized bilateral pleural effusions present.  Also there is opacity within the superior segment of the right lower lobe and in the left lower lobe consistent with pneumonia.  Fluid tracks into the right major and minor fissures creating a pseudotumor appearance.  No pneumothorax is seen.  The central airway is patent.  There is irregularity of the superior endplate of T9 vertebral body posteriorly and possibly to a lesser degree of the inferior endplate of T8 vertebral body posteriorly.  These defects may be postoperative in nature, with a small amount  of air is present and infection cannot be excluded.  A sclerotic lesion is noted and T10 vertebral body.  On soft tissue window images, the thyroid gland is unremarkable.  A few mediastinal nodes are present none of which are pathologically enlarged currently.  The heart is within normal limits in size.  No abnormality of the upper abdomen is seen.  IMPRESSION:  1.  Bilateral pleural effusions right greater than left. 2.  Parenchymal opacities in the superior segment of the right lower lobe and within the left lower lobe suspicious for pneumonia with air bronchograms present. 3.  Abnormality of the inferior endplate of T8 and superior endplate of T9 may be postoperative, but infection cannot be excluded. 4.  Sclerotic lesion in T10.  Question significance.   Original Report Authenticated By: Dwyane Dee, M.D.    7:22 AM We will treat the patient with Levaquin for possible postoperative pneumonia as demonstrated on the CT scan. She is arty contacted her neurosurgeon's  office and plans to arrange followup tomorrow. She was advised of CT findings and that an MRI may be indicated to evaluate for postoperative infection.  In the meantime we will add a fentanyl patch 25 mcg per hour for better control of her pain.        Hanley Seamen, MD 11/08/12 [phone]

## 2012-11-08 NOTE — ED Notes (Signed)
Pt reports severe right rib cage pain, not controlled by medications at home

## 2012-11-08 NOTE — ED Notes (Signed)
See note from EMS.

## 2012-11-10 NOTE — ED Provider Notes (Signed)
Medical screening examination/treatment/procedure(s) were performed by non-physician practitioner and as supervising physician I was immediately available for consultation/collaboration.  Kaylen Motl K Linker, MD 11/10/12 1522 

## 2012-12-30 ENCOUNTER — Other Ambulatory Visit: Payer: Self-pay

## 2013-08-16 IMAGING — CT CT CHEST W/O CM
2 of 3 series · 15 of 36 positions shown, 18 images · non-contrast
Comparison: Portable chest x-ray of 11/01/2012

CLINICAL DATA: History of transthoracic discectomy, postoperative
back pain

CT CHEST WITHOUT CONTRAST
TECHNIQUE: Multidetector CT imaging of the chest was performed
following the standard protocol without IV contrast.

[Series 2: chest 5.0 b31f · axial · 0.64mm/px · z∈[+1140,+1370]mm · 12 of 54 slices shown, 15 images]
[im 4/54  mediastinal]
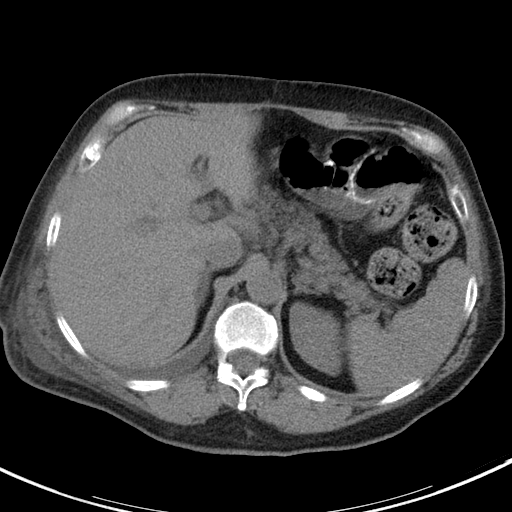
[im 4/54  lung]
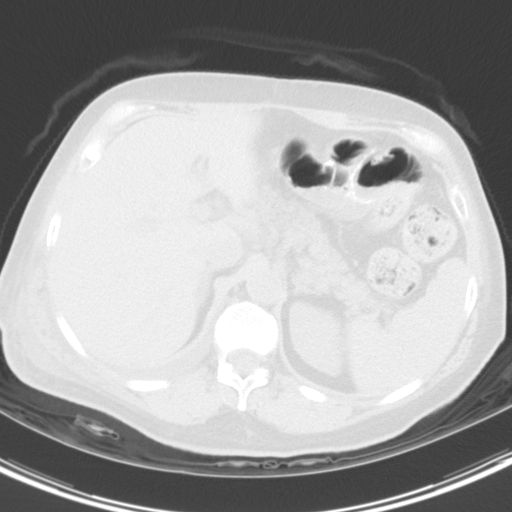
[im 8/54  lung]
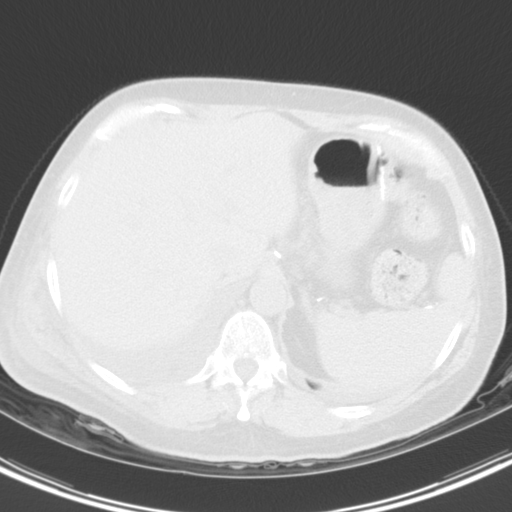
[im 12/54  lung]
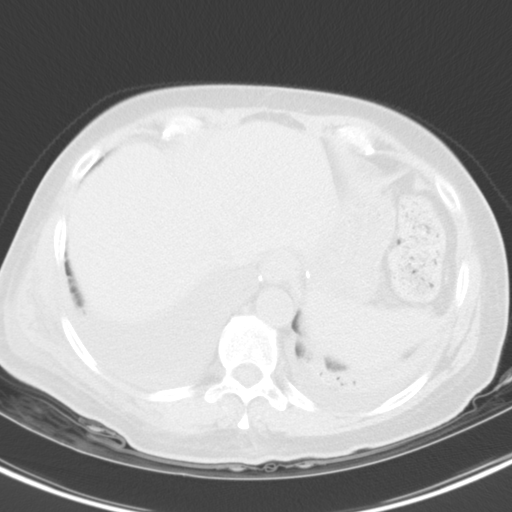
[im 16/54  lung]
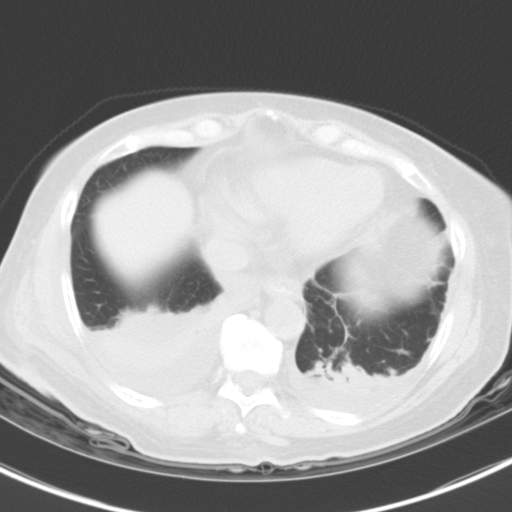
[im 20/54  mediastinal]
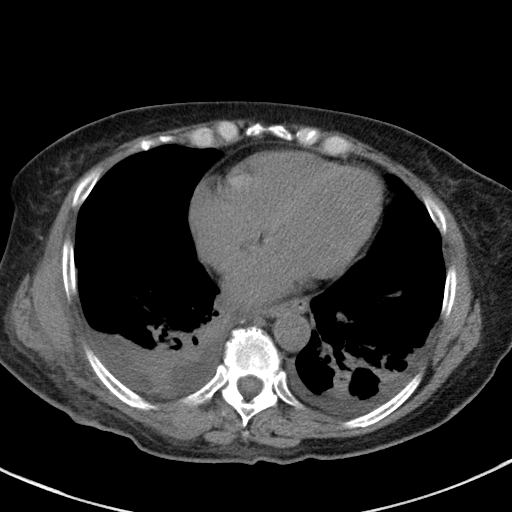
[im 20/54  lung]
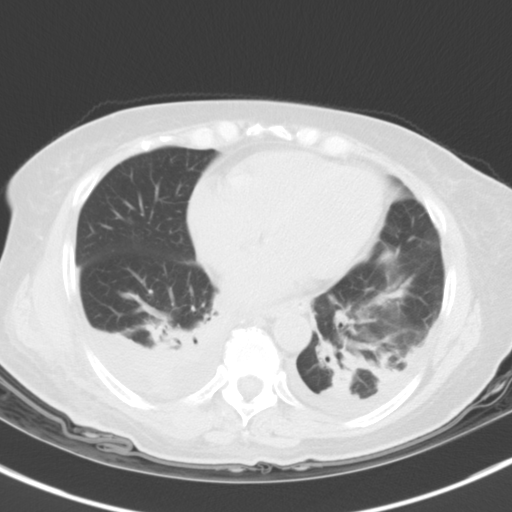
[im 24/54  lung]
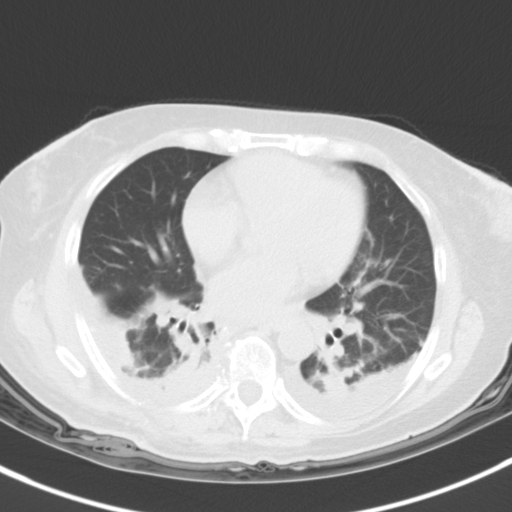
[im 30/54  lung]
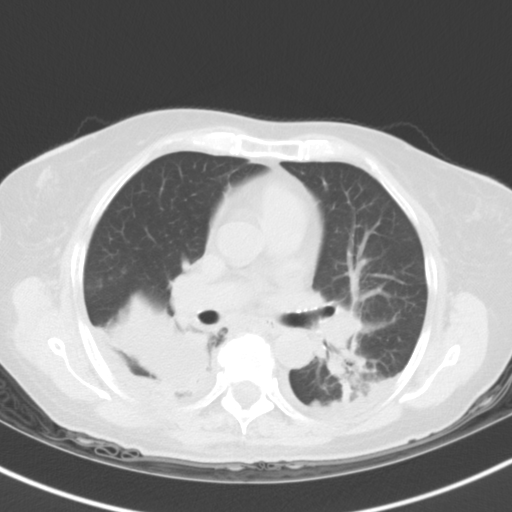
[im 34/54  lung]
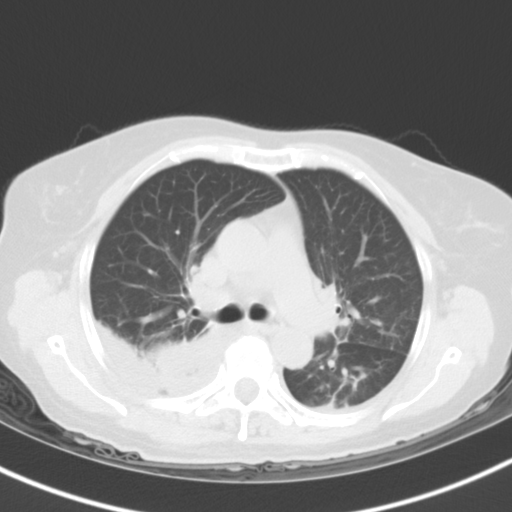
[im 38/54  mediastinal]
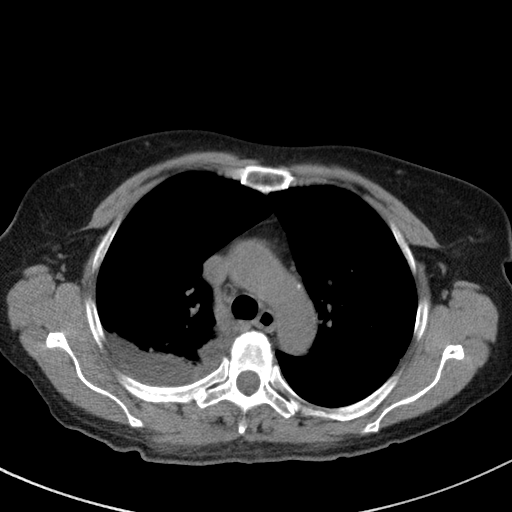
[im 38/54  lung]
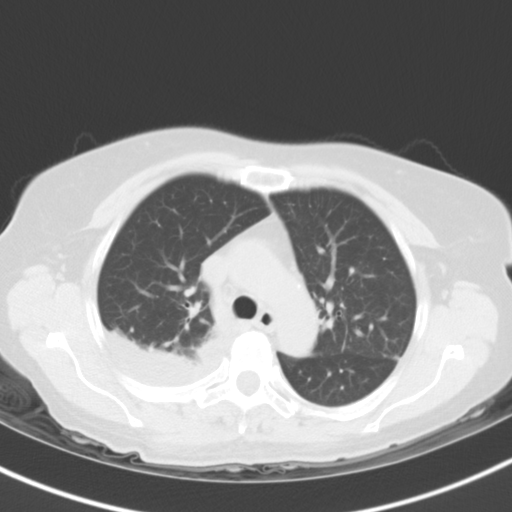
[im 42/54  lung]
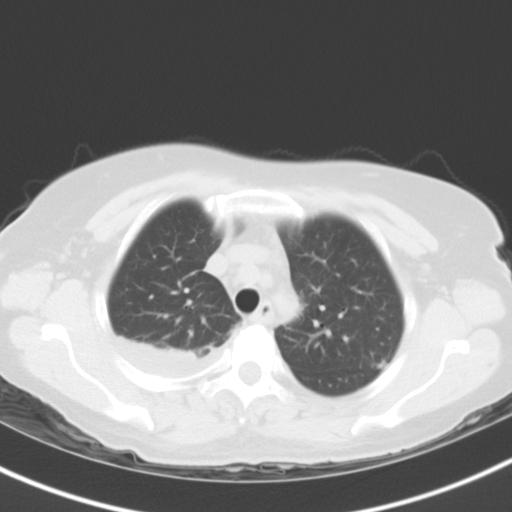
[im 46/54  lung]
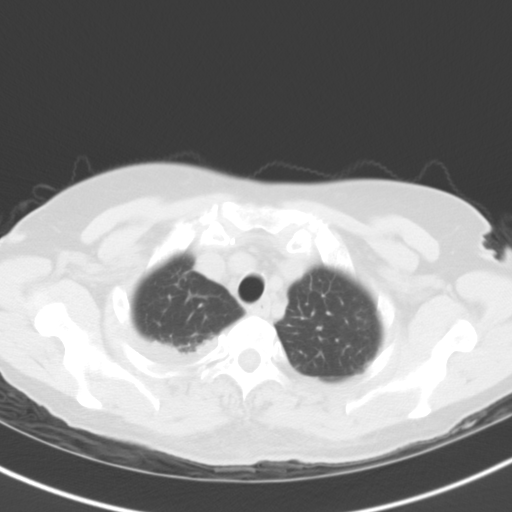
[im 50/54  lung]
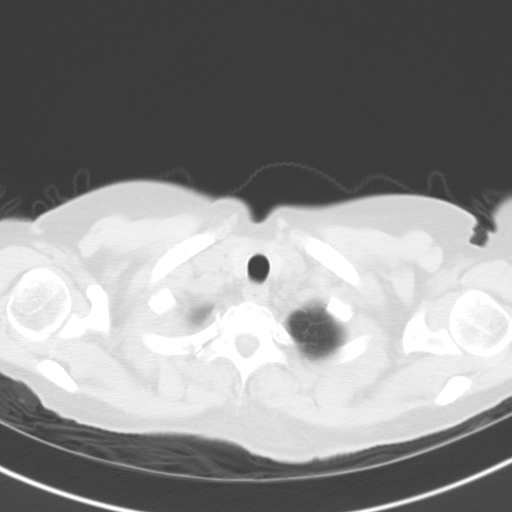

[Series 6: chest 3.0 coronal · coronal · 0.54mm/px · 3 of 75 slices shown]
[im 15/75  lung]
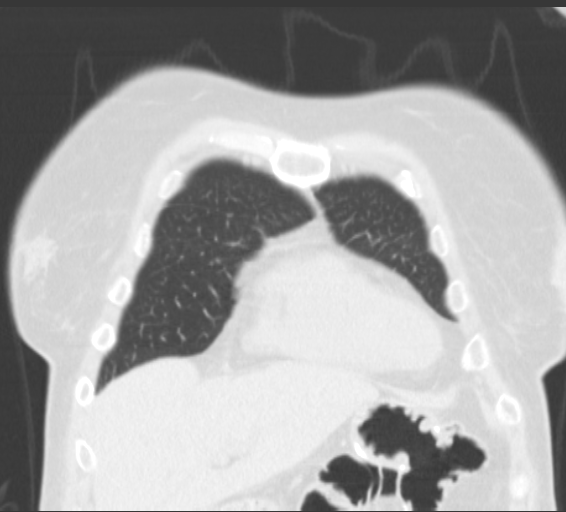
[im 30/75  lung]
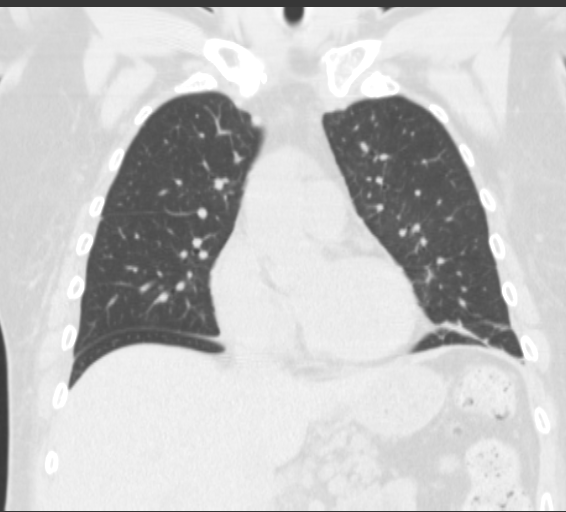
[im 45/75  lung]
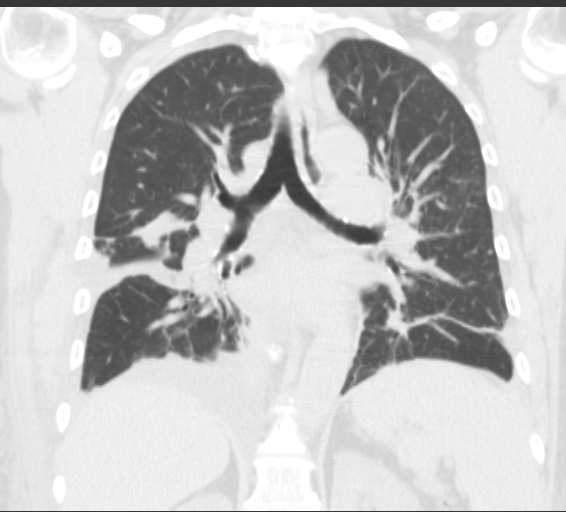

[15 of 36 positions shown; findings below may reference images not displayed]

FINDINGS: There are small to moderate-sized bilateral pleural
effusions present.  Also there is opacity within the superior
segment of the right lower lobe and in the left lower lobe
consistent with pneumonia.  Fluid tracks into the right major and
minor fissures creating a pseudotumor appearance.  No pneumothorax
is seen.  The central airway is patent.  There is irregularity of
the superior endplate of T9 vertebral body posteriorly and possibly
to a lesser degree of the inferior endplate of T8 vertebral body
posteriorly.  These defects may be postoperative in nature, with a
small amount of air is present and infection cannot be excluded.  A
sclerotic lesion is noted and T10 vertebral body.

On soft tissue window images, the thyroid gland is unremarkable.  A
few mediastinal nodes are present none of which are pathologically
enlarged currently.  The heart is within normal limits in size.  No
abnormality of the upper abdomen is seen.
IMPRESSION: 1.  Bilateral pleural effusions right greater than left.
2.  Parenchymal opacities in the superior segment of the right
lower lobe and within the left lower lobe suspicious for pneumonia
with air bronchograms present.
3.  Abnormality of the inferior endplate of T8 and superior
endplate of T[DATE] be postoperative, but infection cannot be
excluded.
4.  Sclerotic lesion in T10.  Question significance.

## 2013-09-20 ENCOUNTER — Other Ambulatory Visit: Payer: Self-pay

## 2020-01-14 HISTORY — PX: PACEMAKER INSERTION: SHX728

## 2020-05-15 HISTORY — PX: CORONARY ANGIOPLASTY WITH STENT PLACEMENT: SHX49

## 2020-06-23 ENCOUNTER — Emergency Department (HOSPITAL_BASED_OUTPATIENT_CLINIC_OR_DEPARTMENT_OTHER)
Admission: EM | Admit: 2020-06-23 | Discharge: 2020-06-23 | Disposition: A | Discharge status: Home / Self Care | Payer: Medicare Other | Source: Home / Self Care | Attending: Emergency Medicine | Admitting: Emergency Medicine

## 2020-06-23 ENCOUNTER — Encounter (HOSPITAL_BASED_OUTPATIENT_CLINIC_OR_DEPARTMENT_OTHER): Payer: Self-pay | Admitting: *Deleted

## 2020-06-23 ENCOUNTER — Encounter (HOSPITAL_BASED_OUTPATIENT_CLINIC_OR_DEPARTMENT_OTHER): Payer: Self-pay | Admitting: Emergency Medicine

## 2020-06-23 ENCOUNTER — Other Ambulatory Visit: Payer: Self-pay

## 2020-06-23 ENCOUNTER — Emergency Department (HOSPITAL_BASED_OUTPATIENT_CLINIC_OR_DEPARTMENT_OTHER): Payer: Medicare Other

## 2020-06-23 ENCOUNTER — Inpatient Hospital Stay (HOSPITAL_BASED_OUTPATIENT_CLINIC_OR_DEPARTMENT_OTHER)
Admission: EM | Admit: 2020-06-23 | Discharge: 2020-06-28 | DRG: 392 | Disposition: A | Discharge status: Home / Self Care | Payer: Medicare Other | Attending: Family Medicine | Admitting: Family Medicine

## 2020-06-23 DIAGNOSIS — I2510 Atherosclerotic heart disease of native coronary artery without angina pectoris: Secondary | ICD-10-CM | POA: Diagnosis not present

## 2020-06-23 DIAGNOSIS — R1084 Generalized abdominal pain: Secondary | ICD-10-CM

## 2020-06-23 DIAGNOSIS — R197 Diarrhea, unspecified: Secondary | ICD-10-CM | POA: Insufficient documentation

## 2020-06-23 DIAGNOSIS — Z9049 Acquired absence of other specified parts of digestive tract: Secondary | ICD-10-CM | POA: Diagnosis not present

## 2020-06-23 DIAGNOSIS — K2900 Acute gastritis without bleeding: Principal | ICD-10-CM | POA: Diagnosis present

## 2020-06-23 DIAGNOSIS — K3189 Other diseases of stomach and duodenum: Secondary | ICD-10-CM | POA: Diagnosis not present

## 2020-06-23 DIAGNOSIS — K2950 Unspecified chronic gastritis without bleeding: Secondary | ICD-10-CM | POA: Diagnosis not present

## 2020-06-23 DIAGNOSIS — R112 Nausea with vomiting, unspecified: Secondary | ICD-10-CM

## 2020-06-23 DIAGNOSIS — E039 Hypothyroidism, unspecified: Secondary | ICD-10-CM | POA: Diagnosis present

## 2020-06-23 DIAGNOSIS — Z20822 Contact with and (suspected) exposure to covid-19: Secondary | ICD-10-CM | POA: Diagnosis present

## 2020-06-23 DIAGNOSIS — M1990 Unspecified osteoarthritis, unspecified site: Secondary | ICD-10-CM | POA: Diagnosis present

## 2020-06-23 DIAGNOSIS — I4510 Unspecified right bundle-branch block: Secondary | ICD-10-CM | POA: Diagnosis present

## 2020-06-23 DIAGNOSIS — F419 Anxiety disorder, unspecified: Secondary | ICD-10-CM | POA: Diagnosis not present

## 2020-06-23 DIAGNOSIS — R079 Chest pain, unspecified: Secondary | ICD-10-CM

## 2020-06-23 DIAGNOSIS — Z79899 Other long term (current) drug therapy: Secondary | ICD-10-CM | POA: Insufficient documentation

## 2020-06-23 DIAGNOSIS — E876 Hypokalemia: Secondary | ICD-10-CM | POA: Diagnosis present

## 2020-06-23 DIAGNOSIS — K289 Gastrojejunal ulcer, unspecified as acute or chronic, without hemorrhage or perforation: Secondary | ICD-10-CM

## 2020-06-23 DIAGNOSIS — Z90710 Acquired absence of both cervix and uterus: Secondary | ICD-10-CM

## 2020-06-23 DIAGNOSIS — D72823 Leukemoid reaction: Secondary | ICD-10-CM | POA: Diagnosis present

## 2020-06-23 DIAGNOSIS — Z86010 Personal history of colonic polyps: Secondary | ICD-10-CM

## 2020-06-23 DIAGNOSIS — E860 Dehydration: Secondary | ICD-10-CM | POA: Diagnosis present

## 2020-06-23 DIAGNOSIS — Z950 Presence of cardiac pacemaker: Secondary | ICD-10-CM | POA: Diagnosis not present

## 2020-06-23 DIAGNOSIS — Z79890 Hormone replacement therapy (postmenopausal): Secondary | ICD-10-CM

## 2020-06-23 DIAGNOSIS — G4730 Sleep apnea, unspecified: Secondary | ICD-10-CM | POA: Diagnosis present

## 2020-06-23 DIAGNOSIS — Z791 Long term (current) use of non-steroidal anti-inflammatories (NSAID): Secondary | ICD-10-CM

## 2020-06-23 DIAGNOSIS — Z8711 Personal history of peptic ulcer disease: Secondary | ICD-10-CM

## 2020-06-23 DIAGNOSIS — I441 Atrioventricular block, second degree: Secondary | ICD-10-CM | POA: Diagnosis not present

## 2020-06-23 DIAGNOSIS — K2100 Gastro-esophageal reflux disease with esophagitis, without bleeding: Secondary | ICD-10-CM | POA: Diagnosis not present

## 2020-06-23 DIAGNOSIS — Z980 Intestinal bypass and anastomosis status: Secondary | ICD-10-CM

## 2020-06-23 DIAGNOSIS — G43909 Migraine, unspecified, not intractable, without status migrainosus: Secondary | ICD-10-CM | POA: Diagnosis not present

## 2020-06-23 DIAGNOSIS — Z955 Presence of coronary angioplasty implant and graft: Secondary | ICD-10-CM

## 2020-06-23 DIAGNOSIS — Z7982 Long term (current) use of aspirin: Secondary | ICD-10-CM

## 2020-06-23 DIAGNOSIS — F329 Major depressive disorder, single episode, unspecified: Secondary | ICD-10-CM | POA: Diagnosis present

## 2020-06-23 DIAGNOSIS — R1010 Upper abdominal pain, unspecified: Secondary | ICD-10-CM | POA: Diagnosis not present

## 2020-06-23 DIAGNOSIS — Z886 Allergy status to analgesic agent status: Secondary | ICD-10-CM

## 2020-06-23 DIAGNOSIS — Z888 Allergy status to other drugs, medicaments and biological substances status: Secondary | ICD-10-CM

## 2020-06-23 DIAGNOSIS — K209 Esophagitis, unspecified without bleeding: Secondary | ICD-10-CM

## 2020-06-23 DIAGNOSIS — E785 Hyperlipidemia, unspecified: Secondary | ICD-10-CM | POA: Diagnosis present

## 2020-06-23 DIAGNOSIS — Z887 Allergy status to serum and vaccine status: Secondary | ICD-10-CM

## 2020-06-23 DIAGNOSIS — Z91018 Allergy to other foods: Secondary | ICD-10-CM

## 2020-06-23 DIAGNOSIS — Z885 Allergy status to narcotic agent status: Secondary | ICD-10-CM

## 2020-06-23 DIAGNOSIS — Z7902 Long term (current) use of antithrombotics/antiplatelets: Secondary | ICD-10-CM

## 2020-06-23 LAB — CBC
HCT: 42.8 % (ref 36.0–46.0)
Hemoglobin: 14.4 g/dL (ref 12.0–15.0)
MCH: 28.8 pg (ref 26.0–34.0)
MCHC: 33.6 g/dL (ref 30.0–36.0)
MCV: 85.6 fL (ref 80.0–100.0)
Platelets: 457 10*3/uL — ABNORMAL HIGH (ref 150–400)
RBC: 5 MIL/uL (ref 3.87–5.11)
RDW: 12.3 % (ref 11.5–15.5)
WBC: 13 10*3/uL — ABNORMAL HIGH (ref 4.0–10.5)
nRBC: 0 % (ref 0.0–0.2)

## 2020-06-23 LAB — BASIC METABOLIC PANEL
Anion gap: 14 (ref 5–15)
BUN: 20 mg/dL (ref 8–23)
CO2: 18 mmol/L — ABNORMAL LOW (ref 22–32)
Calcium: 9 mg/dL (ref 8.9–10.3)
Chloride: 106 mmol/L (ref 98–111)
Creatinine, Ser: 1.08 mg/dL — ABNORMAL HIGH (ref 0.44–1.00)
GFR calc Af Amer: >60 mL/min (ref >60)
GFR calc non Af Amer: 53 mL/min — ABNORMAL LOW (ref >60)
Glucose, Bld: 145 mg/dL — ABNORMAL HIGH (ref 70–99)
Potassium: 3.4 mmol/L — ABNORMAL LOW (ref 3.5–5.1)
Sodium: 138 mmol/L (ref 135–145)

## 2020-06-23 LAB — CBC WITH DIFFERENTIAL/PLATELET
Abs Immature Granulocytes: 0.11 10*3/uL — ABNORMAL HIGH (ref 0.00–0.07)
Basophils Absolute: 0 10*3/uL (ref 0.0–0.1)
Basophils Relative: 0 %
Eosinophils Absolute: 0 10*3/uL (ref 0.0–0.5)
Eosinophils Relative: 0 %
HCT: 38.5 % (ref 36.0–46.0)
Hemoglobin: 13.2 g/dL (ref 12.0–15.0)
Immature Granulocytes: 1 %
Lymphocytes Relative: 10 %
Lymphs Abs: 1.5 10*3/uL (ref 0.7–4.0)
MCH: 29.2 pg (ref 26.0–34.0)
MCHC: 34.3 g/dL (ref 30.0–36.0)
MCV: 85.2 fL (ref 80.0–100.0)
Monocytes Absolute: 1.1 10*3/uL — ABNORMAL HIGH (ref 0.1–1.0)
Monocytes Relative: 7 %
Neutro Abs: 12.4 10*3/uL — ABNORMAL HIGH (ref 1.7–7.7)
Neutrophils Relative %: 82 %
Platelets: 387 10*3/uL (ref 150–400)
RBC: 4.52 MIL/uL (ref 3.87–5.11)
RDW: 12.5 % (ref 11.5–15.5)
WBC: 15.2 10*3/uL — ABNORMAL HIGH (ref 4.0–10.5)
nRBC: 0 % (ref 0.0–0.2)

## 2020-06-23 LAB — TROPONIN I (HIGH SENSITIVITY): Troponin I (High Sensitivity): 9 ng/L (ref <18)

## 2020-06-23 LAB — COMPREHENSIVE METABOLIC PANEL
ALT: 22 U/L (ref 0–44)
AST: 31 U/L (ref 15–41)
Albumin: 4.5 g/dL (ref 3.5–5.0)
Alkaline Phosphatase: 87 U/L (ref 38–126)
Anion gap: 15 (ref 5–15)
BUN: 18 mg/dL (ref 8–23)
CO2: 22 mmol/L (ref 22–32)
Calcium: 9.6 mg/dL (ref 8.9–10.3)
Chloride: 103 mmol/L (ref 98–111)
Creatinine, Ser: 1.27 mg/dL — ABNORMAL HIGH (ref 0.44–1.00)
GFR calc Af Amer: 51 mL/min — ABNORMAL LOW (ref >60)
GFR calc non Af Amer: 44 mL/min — ABNORMAL LOW (ref >60)
Glucose, Bld: 142 mg/dL — ABNORMAL HIGH (ref 70–99)
Potassium: 5.4 mmol/L — ABNORMAL HIGH (ref 3.5–5.1)
Sodium: 140 mmol/L (ref 135–145)
Total Bilirubin: 1.1 mg/dL (ref 0.3–1.2)
Total Protein: 7.9 g/dL (ref 6.5–8.1)

## 2020-06-23 LAB — URINALYSIS, ROUTINE W REFLEX MICROSCOPIC
Glucose, UA: NEGATIVE mg/dL
Hgb urine dipstick: NEGATIVE
Ketones, ur: 40 mg/dL — AB
Leukocytes,Ua: NEGATIVE
Nitrite: NEGATIVE
Protein, ur: NEGATIVE mg/dL
Specific Gravity, Urine: 1.025 (ref 1.005–1.030)
pH: 6 (ref 5.0–8.0)

## 2020-06-23 LAB — LIPASE, BLOOD: Lipase: 32 U/L (ref 11–51)

## 2020-06-23 MED ORDER — PROMETHAZINE HCL 25 MG/ML IJ SOLN
INTRAMUSCULAR | Status: AC
  Filled 2020-06-23: qty 1

## 2020-06-23 MED ORDER — SODIUM CHLORIDE 0.9 % IV BOLUS
1000.0000 mL | Freq: Once | INTRAVENOUS | Status: AC
  Administered 2020-06-23: 1000 mL via INTRAVENOUS

## 2020-06-23 MED ORDER — FENTANYL CITRATE (PF) 100 MCG/2ML IJ SOLN
25.0000 ug | Freq: Once | INTRAMUSCULAR | Status: AC
  Administered 2020-06-23: 25 ug via INTRAVENOUS
  Filled 2020-06-23: qty 2

## 2020-06-23 MED ORDER — FENTANYL CITRATE (PF) 100 MCG/2ML IJ SOLN
50.0000 ug | Freq: Once | INTRAMUSCULAR | Status: AC
  Administered 2020-06-23: 50 ug via INTRAVENOUS
  Filled 2020-06-23: qty 2

## 2020-06-23 MED ORDER — FAMOTIDINE IN NACL 20-0.9 MG/50ML-% IV SOLN
20.0000 mg | Freq: Once | INTRAVENOUS | Status: AC
  Administered 2020-06-23: 20 mg via INTRAVENOUS
  Filled 2020-06-23: qty 50

## 2020-06-23 MED ORDER — HALOPERIDOL LACTATE 5 MG/ML IJ SOLN
5.0000 mg | Freq: Once | INTRAMUSCULAR | Status: AC
  Administered 2020-06-23: 5 mg via INTRAMUSCULAR
  Filled 2020-06-23: qty 1

## 2020-06-23 MED ORDER — LORAZEPAM 2 MG/ML IJ SOLN
1.0000 mg | Freq: Once | INTRAMUSCULAR | Status: AC
  Administered 2020-06-23: 1 mg via INTRAVENOUS
  Filled 2020-06-23: qty 1

## 2020-06-23 MED ORDER — ONDANSETRON HCL 4 MG/2ML IJ SOLN
4.0000 mg | Freq: Once | INTRAMUSCULAR | Status: AC | PRN
  Administered 2020-06-23: 4 mg via INTRAVENOUS
  Filled 2020-06-23: qty 2

## 2020-06-23 MED ORDER — SODIUM CHLORIDE 0.9 % IV SOLN: INTRAVENOUS | Status: DC | PRN

## 2020-06-23 MED ORDER — IOHEXOL 300 MG/ML  SOLN
100.0000 mL | Freq: Once | INTRAMUSCULAR | Status: AC | PRN
  Administered 2020-06-23: 100 mL via INTRAVENOUS

## 2020-06-23 MED ORDER — PROMETHAZINE HCL 25 MG/ML IJ SOLN
12.5000 mg | Freq: Once | INTRAMUSCULAR | Status: AC
  Administered 2020-06-23: 12.5 mg via INTRAVENOUS

## 2020-06-23 MED ORDER — PROMETHAZINE HCL 25 MG PO TABS
25.0000 mg | ORAL_TABLET | Freq: Four times a day (QID) | ORAL | 0 refills | Status: DC | PRN
Start: 2020-06-23 — End: 2020-07-10

## 2020-06-23 NOTE — ED Notes (Signed)
Pt very spastic and unable to stay still, unable to update vital signs at this time. Haldol given.

## 2020-06-23 NOTE — ED Triage Notes (Addendum)
States she has been her twice today for nausea. Exam was normal. Crying noises with no tears that she can stop to answer questions.

## 2020-06-23 NOTE — ED Notes (Signed)
ED Provider at bedside. 

## 2020-06-23 NOTE — ED Triage Notes (Signed)
Abdominal pain with N/V/D since yesterday.

## 2020-06-23 NOTE — ED Provider Notes (Signed)
[facility] EMERGENCY DEPARTMENT Provider Note   CSN: [id number] Arrival date & time: 06/23/20  0539     History Chief Complaint  Patient presents with  . Abdominal Pain    Tiffany Villarreal is a 69 y.o. female.  Complaining of generalized abdominal pain nausea vomiting and diarrhea that started yesterday.  Remote history of same.  Denies any blood in the vomit or diarrhea.  No known fevers or chills.  Multiple abdominal surgeries.  Follows with GI at [facility].  The history is provided by the patient and the spouse.  Abdominal Pain Pain location:  Generalized Pain quality: cramping   Pain severity:  Severe Onset quality:  Gradual Duration:  2 days Timing:  Constant Progression:  Worsening Chronicity:  Recurrent Context: not trauma   Relieved by:  Nothing Worsened by:  Nothing Ineffective treatments:  None tried Associated symptoms: diarrhea, nausea and vomiting   Associated symptoms: no chest pain, no chills, no constipation, no cough, no dysuria, no fever, no hematemesis, no hematochezia, no hematuria, no shortness of breath and no sore throat        Past Medical History:  Diagnosis Date  . Anemia    takes Iron caps daily  . Anxiety    takes Remeron and Adderall  . Arthritis   . Back pain   . Chronic back pain   . Depression    takes Buspar,Klonopin,and Sertraline daily  . Diarrhea    since stomach surgeries  . Dizziness    related spine   . Dysrhythmia    takes Metoprolol daily  . History of blood transfusion    no abnormal reaction noted  . History of bronchitis    recently and was on and has completed an antibiotic  . History of colon polyps   . History of migraine    last one a few wks ago and takes ZOmig prn  . Hyperlipidemia    takes Livalo daily  . Hypothyroidism    but doesn't require meds  . Interstitial cystitis    takes Uribel and Elmiron bid   . Muscle spasms of head and/or neck    takes Zanaflex prn  . Osteoarthritis   .  Peripheral edema   . Pneumonia    last time a few yrs ago  . PONV (postoperative nausea and vomiting)    extreme vomiting;pt states she had to have a picc line  after 2 surgeries  . Sleep apnea    uses CPAP  . Urinary frequency   . Urinary urgency     Patient Active Problem List   Diagnosis Date Noted  . Herniated nucleus pulposus with myelopathy, thoracic 10/30/2012    Past Surgical History:  Procedure Laterality Date  . ABDOMINAL HYSTERECTOMY  1988  . APPENDECTOMY  1970  . bladder tumor removed  2002   benign  . ceasarian    . CHOLECYSTECTOMY  2010  . COLONOSCOPY    . ESOPHAGOGASTRODUODENOSCOPY    . LAPAROSCOPIC GASTROTOMY W/ REPAIR OF ULCER    . THORACIC DISCECTOMY  10/30/2012   Procedure: THORACIC DISCECTOMY;  Surgeon: Temple Pacini, MD;  Location: [facility];  Service: Neurosurgery;  Laterality: Right;  Dr. Dorris Fetch to perform Right thoracotomy for exposure  . THORACIC EXPOSURE  10/30/2012   Procedure: THORACIC EXPOSURE;  Surgeon: Loreli Slot, MD;  Location: [facility];  Service: Thoracic;  Laterality: Right;  Thoracic Exposure  . TONSILLECTOMY  1969     OB History   No  obstetric history on file.     No family history on file.  Social History   Tobacco Use  . Smoking status: Never Smoker  . Smokeless tobacco: Never Used  Substance Use Topics  . Alcohol use: No  . Drug use: No    Home Medications Prior to Admission medications   Medication Sig Start Date End Date Taking? Authorizing Provider  acetaminophen (TYLENOL) 500 MG tablet Take 1,000 mg by mouth every 6 (six) hours as needed. For pain    [provider]  amphetamine-dextroamphetamine (ADDERALL) 10 MG tablet Take 30 mg by mouth every morning.    [provider]  azelastine (ASTELIN) 137 MCG/SPRAY nasal spray Place 2 sprays into the nose 2 (two) times daily as needed. For allergies    [provider]  busPIRone (BUSPAR) 15 MG tablet Take 15 mg by mouth 3  (three) times daily.    [provider]  Calcium-Magnesium-Zinc (CAL-MAG-ZINC PO) Take 1 tablet by mouth daily.     [provider]  clonazePAM (KLONOPIN) 1 MG tablet Take 3 mg by mouth at bedtime.    [provider]  diazepam (VALIUM) 5 MG tablet Take 1 tablet (5 mg total) by mouth 2 (two) times daily. 11/08/12   Teressa LowerPickering, Vrinda, NP  estrogens, conjugated, (PREMARIN) 0.3 MG tablet Take 0.3 mg by mouth daily.     [provider]  fentaNYL (DURAGESIC) 25 MCG/HR Place patch on skin and leave in place for 3 days. Replace after 3 days; remove old patch when applying new patch. 11/08/12   Molpus, John, MD  HYDROcodone-acetaminophen (NORCO) 10-325 MG per tablet Take 1-2 tablets by mouth every 4 (four) hours as needed for pain. For pain 11/01/12   Julio SicksPool, Henry, MD  Iron Polysacch Cmplx-B12-FA (FERREX 150 FORTE) 150-0.025-1 MG CAPS Take 150 mg by mouth daily.     [provider]  ketorolac (TORADOL) 10 MG tablet Take 1 tablet (10 mg total) by mouth 2 (two) times daily. 11/01/12   Julio SicksPool, Henry, MD  levofloxacin (LEVAQUIN) 750 MG tablet Take 1 tablet daily starting Thursday, 11/09/2012. 11/08/12   Molpus, John, MD  Meth-Hyo-M Bl-Na Phos-Ph Sal (URIBEL) 118 MG CAPS Take 118 mg by mouth as needed.     [provider]  metoprolol succinate (TOPROL-XL) 50 MG 24 hr tablet Take 50 mg by mouth daily. Take with or immediately following a meal.    [provider]  mirtazapine (REMERON) 30 MG tablet Take 30 mg by mouth at bedtime.    [provider]  Multiple Vitamins-Minerals (MULTIVITAMIN GUMMIES ADULT PO) Take 2 tablets by mouth daily.    [provider]  pentosan polysulfate (ELMIRON) 100 MG capsule Take 100 mg by mouth 2 (two) times daily. Two caps in the morning, two caps at bedtime    [provider]  Pitavastatin Calcium (LIVALO) 4 MG TABS Take 4 mg by mouth daily.     [provider]  sertraline (ZOLOFT) 100 MG  tablet Take 100 mg by mouth daily.    [provider]  tiZANidine (ZANAFLEX) 2 MG tablet Take 1 tablet (2 mg total) by mouth every 6 (six) hours as needed. For muscle spasms 11/01/12   Julio SicksPool, Henry, MD  zolmitriptan (ZOMIG) 5 MG tablet Take 5 mg by mouth as needed. For migraine    [provider]    Allergies    Dilaudid [hydromorphone hcl], Hydromorphone, Morphine, Morphine and related, Tetanus toxoids, Prochlorperazine, and Compazine [prochlorperazine edisylate]  Review of  Systems   Review of Systems  Constitutional: Negative for chills and fever.  HENT: Negative for sore throat.   Eyes: Negative for visual disturbance.  Respiratory: Negative for cough and shortness of breath.   Cardiovascular: Negative for chest pain and palpitations.  Gastrointestinal: Positive for abdominal pain, diarrhea, nausea and vomiting. Negative for constipation, hematemesis and hematochezia.  Genitourinary: Negative for dysuria and hematuria.  Musculoskeletal: Negative for arthralgias and back pain.  Skin: Negative for rash.  Neurological: Negative for syncope.  All other systems reviewed and are negative.   Physical Exam Updated Vital Signs BP (!) 131/106 (BP Location: Right Arm)   Pulse 86   Temp 98.1 F (36.7 C) (Oral)   Resp 18   Ht 5\' 3"  (1.6 m)   Wt 68 kg   SpO2 100%   BMI 26.57 kg/m   Physical Exam Vitals and nursing note reviewed.  Constitutional:      General: She is in acute distress.     Appearance: She is well-developed.     Comments: Writhing around bed, dry heaving into bag  HENT:     Head: Normocephalic and atraumatic.  Eyes:     Conjunctiva/sclera: Conjunctivae normal.  Cardiovascular:     Rate and Rhythm: Normal rate and regular rhythm.     Heart sounds: No murmur heard.   Pulmonary:     Effort: Pulmonary effort is normal. No respiratory distress.     Breath sounds: Normal breath sounds.  Abdominal:     Palpations: Abdomen is soft.     Tenderness:  There is abdominal tenderness (generalized). There is no guarding or rebound.  Musculoskeletal:        General: Normal range of motion.     Cervical back: Neck supple.     Right lower leg: No edema.     Left lower leg: No edema.  Skin:    General: Skin is warm and dry.     Capillary Refill: Capillary refill takes less than 2 seconds.  Neurological:     General: No focal deficit present.     Mental Status: She is alert.     Sensory: No sensory deficit.     Motor: No weakness.     ED Results / Procedures / Treatments   Labs (all labs ordered are listed, but only abnormal results are displayed) Labs Reviewed  COMPREHENSIVE METABOLIC PANEL - Abnormal; Notable for the following components:      Result Value   Potassium 5.4 (*)    Glucose, Bld 142 (*)    Creatinine, Ser 1.27 (*)    GFR calc non Af Amer 44 (*)    GFR calc Af Amer 51 (*)    All other components within normal limits  CBC - Abnormal; Notable for the following components:   WBC 13.0 (*)    Platelets 457 (*)    All other components within normal limits  URINALYSIS, ROUTINE W REFLEX MICROSCOPIC - Abnormal; Notable for the following components:   Bilirubin Urine SMALL (*)    Ketones, ur 40 (*)    All other components within normal limits  LIPASE, BLOOD    EKG EKG Interpretation  Date/Time:  Monday June 23 2020 11:39:33 EDT Ventricular Rate:  74 PR Interval:    QRS Duration: 139 QT Interval:  511 QTC Calculation: 567 R Axis:   -60 Text Interpretation: 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 CONTRAST:  OMNIPAQUE IOHEXOL 300 MG/ML  SOLN COMPARISON:  2017 FINDINGS: Lower chest: No acute abnormality. Hepatobiliary: Enhancing lesions previously characterized as hemangiomas. Cholecystectomy. No unexpected biliary dilatation. Pancreas: Unremarkable. Spleen: Slight increase in size of small low-attenuation splenic lesions, which are still likely benign. Adrenals/Urinary Tract: Adrenals are unremarkable. Small bilateral renal cysts and additional too small to characterize hypoattenuating lesions. Bladder is unremarkable. Stomach/Bowel: Bowel is normal in caliber with moderate stool burden. Vascular/Lymphatic: Aortic atherosclerosis. There are no enlarged lymph nodes identified. Reproductive: Status post hysterectomy. No adnexal masses. Other: No ascites.  No acute abnormality of the abdominal wall. Musculoskeletal: Stable vertebral body heights. No acute osseous abnormality. IMPRESSION: No acute abnormality or findings to account for reported symptoms. Electronically Signed   By: Guadlupe Spanish M.D.   On: 06/23/2020 12:12    Procedures Procedures (including critical care time)  Medications Ordered in ED Medications  ondansetron (ZOFRAN) injection 4 mg (4 mg Intravenous Given 06/23/20 1041)  sodium chloride 0.9 % bolus 1,000 mL ( Intravenous Stopped 06/23/20 1208)  fentaNYL (SUBLIMAZE) injection 50 mcg (50 mcg Intravenous Given 06/23/20 1051)  promethazine (PHENERGAN) injection 12.5 mg (12.5 mg Intravenous Given 06/23/20 1105)  iohexol (OMNIPAQUE) 300 MG/ML solution 100 mL (100 mLs Intravenous Contrast Given 06/23/20 1150)  famotidine (PEPCID) IVPB 20 mg premix ( Intravenous Stopped 06/23/20 1326)    ED Course  I have reviewed the triage vital signs and the nursing notes.  Pertinent labs & imaging results that were available during my care of the patient were reviewed by me and considered in my medical  decision making (see chart for details).  Clinical Course as of Jun 23 2030  Mon Jun 23, 2020  1238 Reevaluated patient, she is looking much better.  She has had some fluids to drink.  Asks if she can be discharged with Phenergan.  Waiting on her urine to come back.  Have ordered some IV Pepcid.   [MB]  1337 She is tolerating p.o. and just feeling a little nauseous.  Anticipate discharge soon.   [MB]    Clinical Course User Index [MB] Terrilee Files, MD   MDM Rules/Calculators/A&P                         This patient complains of generalized abdominal pain nausea vomiting; this involves an extensive number of treatment Options and is a complaint that carries with it a high risk of complications and Morbidity. The differential includes obstruction, perforation, gastroparesis, cholelithiasis, appendicitis, diverticulitis, colitis  I ordered, reviewed and interpreted labs, which included CBC with elevated white count, normal hemoglobin, chemistries with slightly elevated creatinine and potassium, urine showing ketones no signs of infection, normal LFTs and lipase I ordered medication IV fluids, nausea medication, pain medication, Pepcid I ordered imaging studies which included CAT scan abdomen and pelvis and I independently    visualized and interpreted imaging which showed no acute findings Additional history obtained from patient's husband Previous records obtained and reviewed in epic, no recent visits regarding abdominal pain  After the interventions stated above, I reevaluated the patient and found patient to be  feeling better.  She is resting quietly in bed and tolerating fluids.  She is asking to be discharged and wants a prescription for Phenergan.  She has a GI doctor to follow-up with.  Return instructions discussed.   Final Clinical Impression(s) / ED Diagnoses Final diagnoses:  Generalized abdominal pain  Non-intractable vomiting with nausea, unspecified vomiting type     Rx / DC Orders ED Discharge Orders         Ordered    promethazine (PHENERGAN) 25 MG tablet  Every 6 hours PRN     Discontinue  Reprint     06/23/20 1358           Terrilee Files, MD 06/23/20 2033

## 2020-06-23 NOTE — ED Notes (Addendum)
Ice water given for PO challenge.  Pt sts she is a little nauseated prior to starting the po challenge.

## 2020-06-23 NOTE — ED Notes (Signed)
Pt refusing to leave EKG leads on, stating she feels constricted and can't move with them on. Detached leads per pt request.

## 2020-06-23 NOTE — Discharge Instructions (Addendum)
You are seen in the emergency department for acute onset of nausea vomiting and abdominal pain.  You had blood work and a CAT scan that did not show any obvious explanation for your symptoms.  Your symptoms resolved after some fluids and medication.  Please follow-up with your primary care doctor and your GI specialist.  Return to the emergency department if any worsening or concerning symptoms

## 2020-06-24 DIAGNOSIS — R1010 Upper abdominal pain, unspecified: Secondary | ICD-10-CM

## 2020-06-24 DIAGNOSIS — E039 Hypothyroidism, unspecified: Secondary | ICD-10-CM

## 2020-06-24 DIAGNOSIS — I2510 Atherosclerotic heart disease of native coronary artery without angina pectoris: Secondary | ICD-10-CM | POA: Diagnosis not present

## 2020-06-24 DIAGNOSIS — Z950 Presence of cardiac pacemaker: Secondary | ICD-10-CM | POA: Diagnosis present

## 2020-06-24 DIAGNOSIS — K2900 Acute gastritis without bleeding: Secondary | ICD-10-CM | POA: Diagnosis not present

## 2020-06-24 DIAGNOSIS — R112 Nausea with vomiting, unspecified: Secondary | ICD-10-CM | POA: Diagnosis not present

## 2020-06-24 LAB — COMPREHENSIVE METABOLIC PANEL
ALT: 20 U/L (ref 0–44)
AST: 30 U/L (ref 15–41)
Albumin: 4.1 g/dL (ref 3.5–5.0)
Alkaline Phosphatase: 79 U/L (ref 38–126)
Anion gap: 12 (ref 5–15)
BUN: 20 mg/dL (ref 8–23)
CO2: 19 mmol/L — ABNORMAL LOW (ref 22–32)
Calcium: 8.7 mg/dL — ABNORMAL LOW (ref 8.9–10.3)
Chloride: 108 mmol/L (ref 98–111)
Creatinine, Ser: 0.86 mg/dL (ref 0.44–1.00)
GFR calc Af Amer: >60 mL/min (ref >60)
GFR calc non Af Amer: >60 mL/min (ref >60)
Glucose, Bld: 131 mg/dL — ABNORMAL HIGH (ref 70–99)
Potassium: 3.6 mmol/L (ref 3.5–5.1)
Sodium: 139 mmol/L (ref 135–145)
Total Bilirubin: 0.9 mg/dL (ref 0.3–1.2)
Total Protein: 7 g/dL (ref 6.5–8.1)

## 2020-06-24 LAB — CBC
HCT: 38.9 % (ref 36.0–46.0)
Hemoglobin: 12.3 g/dL (ref 12.0–15.0)
MCH: 29.5 pg (ref 26.0–34.0)
MCHC: 31.6 g/dL (ref 30.0–36.0)
MCV: 93.3 fL (ref 80.0–100.0)
Platelets: 127 10*3/uL — ABNORMAL LOW (ref 150–400)
RBC: 4.17 MIL/uL (ref 3.87–5.11)
RDW: 13 % (ref 11.5–15.5)
WBC: 10.4 10*3/uL (ref 4.0–10.5)
nRBC: 0 % (ref 0.0–0.2)

## 2020-06-24 LAB — MAGNESIUM: Magnesium: 2.1 mg/dL (ref 1.7–2.4)

## 2020-06-24 LAB — RAPID URINE DRUG SCREEN, HOSP PERFORMED
Amphetamines: NOT DETECTED
Barbiturates: NOT DETECTED
Benzodiazepines: NOT DETECTED
Cocaine: NOT DETECTED
Opiates: NOT DETECTED
Tetrahydrocannabinol: NOT DETECTED

## 2020-06-24 LAB — SARS CORONAVIRUS 2 BY RT PCR (HOSPITAL ORDER, PERFORMED IN ~~LOC~~ HOSPITAL LAB): SARS Coronavirus 2: NEGATIVE

## 2020-06-24 LAB — TROPONIN I (HIGH SENSITIVITY): Troponin I (High Sensitivity): 7 ng/L (ref <18)

## 2020-06-24 LAB — LIPASE, BLOOD: Lipase: 28 U/L (ref 11–51)

## 2020-06-24 MED ORDER — ONDANSETRON HCL 4 MG/2ML IJ SOLN
4.0000 mg | Freq: Once | INTRAMUSCULAR | Status: AC
  Administered 2020-06-24: 4 mg via INTRAVENOUS
  Filled 2020-06-24: qty 2

## 2020-06-24 MED ORDER — PROMETHAZINE HCL 25 MG/ML IJ SOLN
12.5000 mg | Freq: Once | INTRAMUSCULAR | Status: AC
  Administered 2020-06-24: 12.5 mg via INTRAVENOUS
  Filled 2020-06-24: qty 1

## 2020-06-24 MED ORDER — PANTOPRAZOLE SODIUM 40 MG IV SOLR
40.0000 mg | Freq: Once | INTRAVENOUS | Status: AC
  Administered 2020-06-24: 40 mg via INTRAVENOUS
  Filled 2020-06-24: qty 40

## 2020-06-24 MED ORDER — PROMETHAZINE HCL 25 MG/ML IJ SOLN
12.5000 mg | Freq: Four times a day (QID) | INTRAMUSCULAR | Status: DC | PRN
  Administered 2020-06-24 – 2020-06-25 (×5): 12.5 mg via INTRAVENOUS
  Filled 2020-06-24 (×5): qty 1

## 2020-06-24 MED ORDER — ASPIRIN 81 MG PO TBEC
81.0000 mg | DELAYED_RELEASE_TABLET | Freq: Every day | ORAL | Status: DC
  Filled 2020-06-24 (×2): qty 1

## 2020-06-24 MED ORDER — ACETAMINOPHEN 650 MG RE SUPP: 650.0000 mg | Freq: Four times a day (QID) | RECTAL | Status: DC | PRN

## 2020-06-24 MED ORDER — ESCITALOPRAM OXALATE 20 MG PO TABS
20.0000 mg | ORAL_TABLET | Freq: Every day | ORAL | Status: DC
  Administered 2020-06-25 – 2020-06-28 (×3): 20 mg via ORAL
  Filled 2020-06-24 (×3): qty 1

## 2020-06-24 MED ORDER — QUETIAPINE FUMARATE 50 MG PO TABS
50.0000 mg | ORAL_TABLET | Freq: Two times a day (BID) | ORAL | Status: DC
  Administered 2020-06-24 – 2020-06-28 (×7): 50 mg via ORAL
  Filled 2020-06-24 (×7): qty 1

## 2020-06-24 MED ORDER — DULOXETINE HCL 30 MG PO CPEP
30.0000 mg | ORAL_CAPSULE | Freq: Every day | ORAL | Status: DC
  Administered 2020-06-25 – 2020-06-28 (×3): 30 mg via ORAL
  Filled 2020-06-24 (×3): qty 1

## 2020-06-24 MED ORDER — METOPROLOL SUCCINATE ER 100 MG PO TB24
100.0000 mg | ORAL_TABLET | Freq: Every day | ORAL | Status: DC
  Administered 2020-06-25 – 2020-06-28 (×3): 100 mg via ORAL
  Filled 2020-06-24 (×3): qty 1

## 2020-06-24 MED ORDER — PANTOPRAZOLE SODIUM 40 MG IV SOLR
40.0000 mg | Freq: Two times a day (BID) | INTRAVENOUS | Status: DC
  Administered 2020-06-24 – 2020-06-28 (×8): 40 mg via INTRAVENOUS
  Filled 2020-06-24 (×9): qty 40

## 2020-06-24 MED ORDER — SODIUM CHLORIDE 0.45 % IV SOLN: INTRAVENOUS | Status: AC

## 2020-06-24 MED ORDER — ACETAMINOPHEN 325 MG PO TABS
650.0000 mg | ORAL_TABLET | Freq: Four times a day (QID) | ORAL | Status: DC | PRN
  Administered 2020-06-26 – 2020-06-27 (×2): 650 mg via ORAL
  Filled 2020-06-24 (×3): qty 2

## 2020-06-24 MED ORDER — SUCRALFATE 1 GM/10ML PO SUSP
1.0000 g | Freq: Four times a day (QID) | ORAL | Status: DC
  Administered 2020-06-24 – 2020-06-28 (×12): 1 g via ORAL
  Filled 2020-06-24 (×13): qty 10

## 2020-06-24 MED ORDER — MIRTAZAPINE 15 MG PO TABS
15.0000 mg | ORAL_TABLET | Freq: Every day | ORAL | Status: DC
  Administered 2020-06-24 – 2020-06-27 (×4): 15 mg via ORAL
  Filled 2020-06-24 (×4): qty 1

## 2020-06-24 MED ORDER — CLOPIDOGREL BISULFATE 75 MG PO TABS
75.0000 mg | ORAL_TABLET | Freq: Every day | ORAL | Status: DC
  Administered 2020-06-25 – 2020-06-28 (×3): 75 mg via ORAL
  Filled 2020-06-24 (×3): qty 1

## 2020-06-24 MED ORDER — LEVOTHYROXINE SODIUM 50 MCG PO TABS
50.0000 ug | ORAL_TABLET | Freq: Every day | ORAL | Status: DC
  Administered 2020-06-25 – 2020-06-28 (×3): 50 ug via ORAL
  Filled 2020-06-24 (×3): qty 1

## 2020-06-24 MED ORDER — FENTANYL CITRATE (PF) 100 MCG/2ML IJ SOLN
12.5000 ug | INTRAMUSCULAR | Status: DC | PRN
  Administered 2020-06-24 (×2): 12.5 ug via INTRAVENOUS
  Filled 2020-06-24 (×2): qty 2

## 2020-06-24 NOTE — ED Notes (Signed)
Pt c/o nausea. Dr. Wilkie Aye updated and orders received. Pt. Agreed to be placed on monitor. Paced rhythm 77.

## 2020-06-24 NOTE — Plan of Care (Signed)
  Problem: Education: Goal: Knowledge of General Education information will improve Description: Including pain rating scale, medication(s)/side effects and non-pharmacologic comfort measures Outcome: Progressing   Problem: Health Behavior/Discharge Planning: Goal: Ability to manage health-related needs will improve Outcome: Progressing   Problem: Clinical Measurements: Goal: Ability to maintain clinical measurements within normal limits will improve Outcome: Progressing   Problem: Elimination: Goal: Will not experience complications related to bowel motility Outcome: Progressing Goal: Will not experience complications related to urinary retention Outcome: Progressing   

## 2020-06-24 NOTE — H&P (Addendum)
[facility] History and Physical  Tiffany Villarreal ZOX:[id number] DOB: 31-Aug-1951 DOA: 06/23/2020   PCP: Bailey Mech, PA-C  Specialists: Followed by neurology in [place].  Also followed by cardiology at [facility].  Chief Complaint: Nausea vomiting upper abdominal pain ongoing for several weeks but worse in the last few days  HPI: Tiffany Villarreal is a 69 y.o. female with a past medical history of coronary artery disease status post DES to LAD in July done at [facility], history of migraine headaches, history of second-degree heart block status post pacemaker, history of GI issues previously for which she has seen gastroenterology many years ago but not recently.  She has had upper endoscopy and colonoscopy many years ago.  She also has a history of hypothyroidism.  She presented to the emergency department on August 9 with complaints of nausea vomiting abdominal pain.  She was given symptomatic treatment.  She had a CT scan done which was unremarkable for any acute process.  She was discharged home.  She came back within a few hours with similar symptoms.  Despite treatment in the ED she did not feel better so she was hospitalized.  She complains of 10 out of 10 pain in the upper abdomen radiating to the chest and to the lower abdomen.  Described as a burning sensation at times.  Associated with nausea and episodes of emesis.  Denies any blood in the emesis.  Last episode of vomiting was yesterday.  Has also been having loose stools.  She has a history of hemorrhoids and occasionally sees blood on the tissue paper.  Has not noticed any more bleeding than that.  Does admit to acid reflux but only takes Pepcid as needed.  Denies any shortness of breath.  She denies any fever or chills.  Has been having some dizziness and lightheadedness.  She apparently lost her balance and fell a week ago.  This is also not uncommon for her.  Denies any weakness on any 1  side of her body.  No episodes suggestive of seizure activity.  She was noted to be mildly hypokalemic.  Troponin level was normal.  Due to lack of improvement she was hospitalized.  Home Medications: Prior to Admission medications   Medication Sig Start Date End Date Taking? Authorizing Provider  ASPIRIN LOW DOSE 81 MG EC tablet Take 81 mg by mouth daily. 05/29/20   [provider]  Calcium-Magnesium-Zinc (CAL-MAG-ZINC PO) Take 1 tablet by mouth daily.     [provider]  clonazePAM (KLONOPIN) 1 MG tablet Take 3 mg by mouth at bedtime.    [provider]  clopidogrel (PLAVIX) 75 MG tablet Take 75 mg by mouth daily. 05/29/20   [provider]  DULoxetine (CYMBALTA) 30 MG capsule Take 30 mg by mouth daily. 06/17/20   [provider]  escitalopram (LEXAPRO) 20 MG tablet Take 20 mg by mouth daily. 04/22/20   [provider]  famotidine (PEPCID) 10 MG tablet Take by mouth.    [provider]  fexofenadine (ALLEGRA) 180 MG tablet Take by mouth.    [provider]  hydrOXYzine (ATARAX/VISTARIL) 25 MG tablet Take 1/2 to 1 tab tid prn anxiety 05/20/20   [provider]  levothyroxine (SYNTHROID) 50 MCG tablet Take 50 mcg by mouth daily. 06/16/20   [provider]  meclizine (ANTIVERT) 25 MG tablet Take by mouth. 06/11/20   [provider]  metoprolol succinate (TOPROL-XL) 100 MG 24 hr tablet Take 100 mg  by mouth 2 (two) times daily. 05/30/20   [provider]  mirtazapine (REMERON) 15 MG tablet Take 15 mg by mouth at bedtime. 06/17/20   [provider]  mirtazapine (REMERON) 30 MG tablet Take 30 mg by mouth at bedtime.    [provider]  modafinil (PROVIGIL) 100 MG tablet Take 50-100 mg by mouth every morning. 02/13/20   [provider]  montelukast (SINGULAIR) 10 MG tablet Take 10 mg by mouth daily. 06/17/20   [provider]  potassium chloride (KLOR-CON) 10 MEQ tablet  Take 10 mEq by mouth daily. 02/07/20   [provider]  promethazine (PHENERGAN) 25 MG tablet Take 1 tablet (25 mg total) by mouth every 6 (six) hours as needed for nausea or vomiting. 06/23/20   Terrilee Files, MD  QUEtiapine (SEROQUEL) 50 MG tablet Take 50 mg by mouth in the morning and at bedtime. 06/17/20   [provider]    Allergies:  Allergies  Allergen Reactions  . Dilaudid [Hydromorphone Hcl] Anaphylaxis and Other (See Comments)    Mental, psychotic  . Hydromorphone Anaphylaxis, Other (See Comments) and Rash    Delusions - Intolerance Mental, psychotic   . Morphine Swelling  . Morphine And Related Swelling, Anaphylaxis, Nausea And Vomiting and Rash    Of lips and tongue Of lips and tongue Of lips and tongue Mental, psychotic Of lips and tongue From [facility] records via Care Everywhere   . Tetanus Toxoids Other (See Comments) and Rash    fever fever   . Gluten Meal     Other reaction(s): Other (See Comments) Intolerance intolerence   . Nsaids Other (See Comments)    Upset stomach, hx of GI bleed  . Prochlorperazine Rash    Stiff neck Stiffness Stiff neck Stiff neck stiffness   . Compazine [Prochlorperazine Edisylate] Other (See Comments)    Stiff neck    Past Medical History: Past Medical History:  Diagnosis Date  . Anemia    takes Iron caps daily  . Anxiety    takes Remeron and Adderall  . Arthritis   . Back pain   . Chronic back pain   . Depression    takes Buspar,Klonopin,and Sertraline daily  . Diarrhea    since stomach surgeries  . Dizziness    related spine   . Dysrhythmia    takes Metoprolol daily  . History of blood transfusion    no abnormal reaction noted  . History of bronchitis    recently and was on and has completed an antibiotic  . History of colon polyps   . History of migraine    last one a few wks ago and takes ZOmig prn  . Hyperlipidemia    takes Livalo daily  . Hypothyroidism    but doesn't require  meds  . Interstitial cystitis    takes Uribel and Elmiron bid   . Muscle spasms of head and/or neck    takes Zanaflex prn  . Osteoarthritis   . Peripheral edema   . Pneumonia    last time a few yrs ago  . PONV (postoperative nausea and vomiting)    extreme vomiting;pt states she had to have a picc line  after 2 surgeries  . Sleep apnea    uses CPAP  . Urinary frequency   . Urinary urgency     Past Surgical History:  Procedure Laterality Date  . ABDOMINAL HYSTERECTOMY  1988  . APPENDECTOMY  1970  . bladder tumor removed  2002  benign  . ceasarian    . CHOLECYSTECTOMY  2010  . COLONOSCOPY    . ESOPHAGOGASTRODUODENOSCOPY    . LAPAROSCOPIC GASTROTOMY W/ REPAIR OF ULCER    . THORACIC DISCECTOMY  10/30/2012   Procedure: THORACIC DISCECTOMY;  Surgeon: Temple Pacini, MD;  Location: [facility];  Service: Neurosurgery;  Laterality: Right;  Dr. Dorris Fetch to perform Right thoracotomy for exposure  . THORACIC EXPOSURE  10/30/2012   Procedure: THORACIC EXPOSURE;  Surgeon: Loreli Slot, MD;  Location: [facility] NEURO ORS;  Service: Thoracic;  Laterality: Right;  Thoracic Exposure  . TONSILLECTOMY  1969    Social History: Lives with her husband.  No history of smoking alcohol use illicit drug use.  Uses a cane to ambulate.  Family History:  History of Alzheimer's on her mother side  Review of Systems - History obtained from the patient General ROS: positive for  - fatigue Psychological ROS: negative Ophthalmic ROS: negative ENT ROS: negative Allergy and Immunology ROS: negative Hematological and Lymphatic ROS: negative Endocrine ROS: negative Respiratory ROS: As in HPI Cardiovascular ROS: As in HPI Gastrointestinal ROS: As in HPI Genito-Urinary ROS: no dysuria, trouble voiding, or hematuria Musculoskeletal ROS: negative Neurological ROS: no TIA or stroke symptoms Dermatological ROS: negative  Physical Examination  Vitals:   06/24/20 0800 06/24/20 0830 06/24/20 0859  06/24/20 1006  BP: 122/71 (!) 142/73 (!) 107/53 (!) 152/78  Pulse:  77 80 77  Resp:  13 18   Temp:  98.4 F (36.9 C)  98.8 F (37.1 C)  TempSrc:  Oral  Oral  SpO2:  97% 95% 97%  Weight:    65.2 kg  Height:    5\' 3"  (1.6 m)    BP (!) 152/78   Pulse 77   Temp 98.8 F (37.1 C) (Oral)   Resp 18   Ht 5\' 3"  (1.6 m)   Wt 65.2 kg   SpO2 97%   BMI 25.46 kg/m   General appearance: alert, cooperative, appears stated age and no distress Head: Normocephalic, without obvious abnormality, atraumatic Eyes: conjunctivae/corneas clear. PERRL, EOM's intact.  Throat: lips, mucosa, and tongue normal; teeth and gums normal Neck: no adenopathy, no carotid bruit, no JVD, supple, symmetrical, trachea midline and thyroid not enlarged, symmetric, no tenderness/mass/nodules Resp: clear to auscultation bilaterally Cardio: regular rate and rhythm, S1, S2 normal, no murmur, click, rub or gallop GI: Abdomen soft.  Mildly tender in the epigastric area without any rebound rigidity or guarding.  No masses organomegaly.  Bowel sounds are present normal. Extremities: extremities normal, atraumatic, no cyanosis or edema Pulses: 2+ and symmetric Skin: Skin color, texture, turgor normal. No rashes or lesions Lymph nodes: Cervical, supraclavicular, and axillary nodes normal. Neurologic: Alert and oriented x3.  No obvious focal neurological deficits noted.  No facial asymmetry.  Motor strength is equal bilateral upper and lower extremities.    Labs on Admission: I have personally reviewed following labs and imaging studies  CBC: Recent Labs  Lab 06/23/20 1036 06/23/20 2150  WBC 13.0* 15.2*  NEUTROABS  --  12.4*  HGB 14.4 13.2  HCT 42.8 38.5  MCV 85.6 85.2  PLT 457* 387   Basic Metabolic Panel: Recent Labs  Lab 06/23/20 1036 06/23/20 2150  NA 140 138  K 5.4* 3.4*  CL 103 106  CO2 22 18*  GLUCOSE 142* 145*  BUN 18 20  CREATININE 1.27* 1.08*  CALCIUM 9.6 9.0   GFR: Estimated Creatinine  Clearance: 46.5 mL/min (A) (by C-G formula based  on SCr of 1.08 mg/dL (H)). Liver Function Tests: Recent Labs  Lab 06/23/20 1036  AST 31  ALT 22  ALKPHOS 87  BILITOT 1.1  PROT 7.9  ALBUMIN 4.5   Recent Labs  Lab 06/23/20 1036  LIPASE 32     Radiological Exams on Admission: CT Abdomen Pelvis W Contrast  Result Date: 06/23/2020 CLINICAL DATA:  Abdominal pain EXAM: CT ABDOMEN AND PELVIS WITH CONTRAST TECHNIQUE: Multidetector CT imaging of the abdomen and pelvis was performed using the standard protocol following bolus administration of intravenous contrast. CONTRAST:  100mL OMNIPAQUE IOHEXOL 300 MG/ML  SOLN COMPARISON:  2017 FINDINGS: Lower chest: No acute abnormality. Hepatobiliary: Enhancing lesions previously characterized as hemangiomas. Cholecystectomy. No unexpected biliary dilatation. Pancreas: Unremarkable. Spleen: Slight increase in size of small low-attenuation splenic lesions, which are still likely benign. Adrenals/Urinary Tract: Adrenals are unremarkable. Small bilateral renal cysts and additional too small to characterize hypoattenuating lesions. Bladder is unremarkable. Stomach/Bowel: Bowel is normal in caliber with moderate stool burden. Vascular/Lymphatic: Aortic atherosclerosis. There are no enlarged lymph nodes identified. Reproductive: Status post hysterectomy. No adnexal masses. Other: No ascites.  No acute abnormality of the abdominal wall. Musculoskeletal: Stable vertebral body heights. No acute osseous abnormality. IMPRESSION: No acute abnormality or findings to account for reported symptoms. Electronically Signed   By: Guadlupe SpanishPraneil  Patel M.D.   On: 06/23/2020 12:12    My interpretation of Electrocardiogram: Sinus rhythm 79.  RBBB.  LAFB.  She does have a pacemaker in situ.   Problem List  Principal Problem:   Acute gastritis Active Problems:   Intractable nausea and vomiting   Upper abdominal pain   Cardiac pacemaker in situ   CAD (coronary artery disease)    Hypothyroidism   Assessment: This is 69 year old Caucasian female with past medical history as stated earlier who comes in with few day history of nausea vomiting diarrhea and abdominal pain.  Her symptoms actually have been ongoing for several weeks.  She supposed to see a gastroenterologist in [place] but has not seen them yet.  Differential diagnosis is broad including acute gastritis, peptic ulcer disease, cardiac etiology (less likely), had a normal lipase level so unlikely to be pancreatitis.  She is status post cholecystectomy as well.  UTI was ruled out.  Plan: 1. Abdominal pain with nausea vomiting and diarrhea: Etiology unclear.  CT scan done yesterday was unremarkable for any acute process which is reassuring.  Patient could have severe acute gastritis.  She will be placed on PPI and Carafate for now.  Clear liquid diet and advance as tolerated.  No clear indication to consult gastroenterology at this time.  Goal will be to get her symptoms under control so that she is able to take orally and then she can follow-up with gastroenterology in [place].  2.  History of coronary artery disease: Had a stent placed in the LAD in July at [facility].  She is on dual antiplatelet agents which will be continued, aspirin and Plavix.  Continue beta-blocker.  Not noted to be on statin.  Troponin done yesterday was negative.  We will recheck another level today to her current symptoms.  3.  History of AV block status post pacemaker: Stable.  4.  History of hypothyroidism: Continue with levothyroxine.  5.  Hypokalemia: Noted to have potassium 3.4 yesterday.  We will repeat labs today. Check Mg.  6.  Dehydration: Check labs today.  Continue with IV fluids.  7.  Leukocytosis: She is afebrile.  No obvious source of infection has  been found.  Likely reactive.  Recheck labs today.  Her medication list also shows that she is on Cymbalta, mirtazapine, Seroquel, Lexapro.   DVT Prophylaxis: SCDs for  now Code Status: Full code Family Communication: Discussed with the patient Disposition: Hopefully return home in improved Consults called: None Admission Status: Status is: Observation  The patient remains OBS appropriate and will d/c before 2 midnights.  Dispo: The patient is from: Home              Anticipated d/c is to: Home              Anticipated d/c date is: 1 day              Patient currently is not medically stable to d/c.   Severity of Illness: The appropriate patient status for this patient is OBSERVATION. Observation status is judged to be reasonable and necessary in order to provide the required intensity of service to ensure the patient's safety. The patient's presenting symptoms, physical exam findings, and initial radiographic and laboratory data in the context of their medical condition is felt to place them at decreased risk for further clinical deterioration. Furthermore, it is anticipated that the patient will be medically stable for discharge from the hospital within 2 midnights of admission. The following factors support the patient status of observation.   " The patient's presenting symptoms include nausea vomiting abdominal pain. " The physical exam findings include abdominal tenderness. " The initial radiographic and laboratory data revealed hypokalemia.   Further management decisions will depend on results of further testing and patient's response to treatment.  Eriel Dunckel [facility]  [facility] [employer] on [email or web address]  06/24/2020, 10:28 AM

## 2020-06-24 NOTE — ED Provider Notes (Signed)
[facility] EMERGENCY DEPARTMENT Provider Note   CSN: [id number] Arrival date & time: 06/23/20  1726     History Chief Complaint  Patient presents with   Nausea    Tiffany Villarreal is a 69 y.o. female with past history of anxiety, cholecystectomy, appendectomy, laparoscopic gastrotomy with repair of ulcer, presenting the emergency department for second visit today with persisting upper abdominal pain with nausea and vomiting.  She was evaluated earlier today with reassuring work-up including negative CT scan.  Lab work showed elevated white count, slightly elevated creatinine and potassium.  She was ultimately treated with antiemetics and fluids, feeling better and discharged with prescription for Phenergan.  She states about 2 hours after returning home her symptoms severely worsened.  Her pain is worse to her epigastric abdomen.  Her nausea persists.  She states she has never had similar symptoms in the past despite abdominal history.  Denies coffee-ground emesis or hematemesis, fevers, chills, urinary symptoms.  GI specialist is with [facility].  The history is provided by the patient.       Past Medical History:  Diagnosis Date   Anemia    takes Iron caps daily   Anxiety    takes Remeron and Adderall   Arthritis    Back pain    Chronic back pain    Depression    takes Buspar,Klonopin,and Sertraline daily   Diarrhea    since stomach surgeries   Dizziness    related spine    Dysrhythmia    takes Metoprolol daily   History of blood transfusion    no abnormal reaction noted   History of bronchitis    recently and was on and has completed an antibiotic   History of colon polyps    History of migraine    last one a few wks ago and takes ZOmig prn   Hyperlipidemia    takes Livalo daily   Hypothyroidism    but doesn't require meds   Interstitial cystitis    takes Uribel and Elmiron bid    Muscle spasms of head and/or neck    takes Zanaflex  prn   Osteoarthritis    Peripheral edema    Pneumonia    last time a few yrs ago   PONV (postoperative nausea and vomiting)    extreme vomiting;pt states she had to have a picc line  after 2 surgeries   Sleep apnea    uses CPAP   Urinary frequency    Urinary urgency     Patient Active Problem List   Diagnosis Date Noted   Intractable nausea and vomiting 06/23/2020   Herniated nucleus pulposus with myelopathy, thoracic 10/30/2012    Past Surgical History:  Procedure Laterality Date   ABDOMINAL HYSTERECTOMY  1988   APPENDECTOMY  1970   bladder tumor removed  2002   benign   ceasarian     CHOLECYSTECTOMY  2010   COLONOSCOPY     ESOPHAGOGASTRODUODENOSCOPY     LAPAROSCOPIC GASTROTOMY W/ REPAIR OF ULCER     THORACIC DISCECTOMY  10/30/2012   Procedure: THORACIC DISCECTOMY;  Surgeon: Temple Pacini, MD;  Location: [facility];  Service: Neurosurgery;  Laterality: Right;  Dr. Dorris Fetch to perform Right thoracotomy for exposure   THORACIC EXPOSURE  10/30/2012   Procedure: THORACIC EXPOSURE;  Surgeon: Loreli Slot, MD;  Location: [facility];  Service: Thoracic;  Laterality: Right;  Thoracic Exposure   TONSILLECTOMY  1969     OB History  No obstetric history on file.     No family history on file.  Social History   Tobacco Use   Smoking status: Never Smoker   Smokeless tobacco: Never Used  Substance Use Topics   Alcohol use: No   Drug use: No    Home Medications Prior to Admission medications   Medication Sig Start Date End Date Taking? Authorizing Provider  ASPIRIN LOW DOSE 81 MG EC tablet Take 81 mg by mouth daily. 05/29/20   [provider]  Calcium-Magnesium-Zinc (CAL-MAG-ZINC PO) Take 1 tablet by mouth daily.     [provider]  clonazePAM (KLONOPIN) 1 MG tablet Take 3 mg by mouth at bedtime.    [provider]  clopidogrel (PLAVIX) 75 MG tablet Take 75 mg by mouth daily. 05/29/20   [provider]  famotidine (PEPCID) 10 MG tablet Take by mouth.    [provider]  fexofenadine (ALLEGRA) 180 MG tablet Take by mouth.    [provider]  hydrOXYzine (ATARAX/VISTARIL) 25 MG tablet Take 1/2 to 1 tab tid prn anxiety 05/20/20   [provider]  levothyroxine (SYNTHROID) 50 MCG tablet Take 50 mcg by mouth daily. 06/16/20   [provider]  meclizine (ANTIVERT) 25 MG tablet Take by mouth. 06/11/20   [provider]  metoprolol succinate (TOPROL-XL) 100 MG 24 hr tablet Take 100 mg by mouth 2 (two) times daily. 05/30/20   [provider]  mirtazapine (REMERON) 30 MG tablet Take 30 mg by mouth at bedtime.    [provider]  modafinil (PROVIGIL) 100 MG tablet Take 50-100 mg by mouth every morning. 02/13/20   [provider]  potassium chloride (KLOR-CON) 10 MEQ tablet Take 10 mEq by mouth daily. 02/07/20   [provider]  promethazine (PHENERGAN) 25 MG tablet Take 1 tablet (25 mg total) by mouth every 6 (six) hours as needed for nausea or vomiting. 06/23/20   Terrilee Files, MD    Allergies    Dilaudid [hydromorphone hcl], Hydromorphone, Morphine, Morphine and related, Tetanus toxoids, Prochlorperazine, and Compazine [prochlorperazine edisylate]  Review of Systems   Review of Systems  Constitutional: Negative for chills and fever.  Gastrointestinal: Positive for abdominal pain, nausea and vomiting.  All other systems reviewed and are negative.   Physical Exam Updated Vital Signs BP (!) 134/93 (BP Location: Left Arm)    Pulse 81    Temp 98.7 F (37.1 C) (Oral)    Resp 16    Ht 5\' 3"  (1.6 m)    Wt 68 kg    SpO2 96%    BMI 26.56 kg/m   Physical Exam Vitals and nursing note reviewed.  Constitutional:      General: She is in acute distress.     Appearance: She is well-developed.     Comments: Patient appears anxious, she is hyperventilating, she is pacing the room and constantly moving about the bed.    HENT:     Head: Normocephalic and atraumatic.  Eyes:     Conjunctiva/sclera: Conjunctivae normal.  Cardiovascular:     Rate and Rhythm: Normal rate and regular rhythm.  Pulmonary:     Effort: Pulmonary effort is normal.     Breath sounds: Normal breath sounds.  Abdominal:     General: Bowel sounds are normal.     Palpations: Abdomen is soft.     Tenderness: There is abdominal tenderness in the right upper quadrant, epigastric area and left upper quadrant. There is no guarding  or rebound.  Skin:    General: Skin is warm.  Neurological:     Mental Status: She is alert.  Psychiatric:        Behavior: Behavior normal.     ED Results / Procedures / Treatments   Labs (all labs ordered are listed, but only abnormal results are displayed) Labs Reviewed  BASIC METABOLIC PANEL - Abnormal; Notable for the following components:      Result Value   Potassium 3.4 (*)    CO2 18 (*)    Glucose, Bld 145 (*)    Creatinine, Ser 1.08 (*)    GFR calc non Af Amer 53 (*)    All other components within normal limits  CBC WITH DIFFERENTIAL/PLATELET - Abnormal; Notable for the following components:   WBC 15.2 (*)    Neutro Abs 12.4 (*)    Monocytes Absolute 1.1 (*)    Abs Immature Granulocytes 0.11 (*)    All other components within normal limits  SARS CORONAVIRUS 2 BY RT PCR (HOSPITAL ORDER, PERFORMED IN [facility])  RAPID URINE DRUG SCREEN, HOSP PERFORMED  TROPONIN I (HIGH SENSITIVITY)    EKG EKG Interpretation  Date/Time:  Monday June 23 2020 21:18:50 EDT Ventricular Rate:  79 PR Interval:    QRS Duration: 133 QT Interval:  456 QTC Calculation: 523 R Axis:   -76 Text Interpretation: Sinus rhythm RBBB and LAFB Confirmed by Marianna Fussykstra, Richard ([id number]) on 06/23/2020 10:54:27 PM   Radiology CT Abdomen Pelvis W Contrast  Result Date: 06/23/2020 CLINICAL DATA:  Abdominal pain EXAM: CT ABDOMEN AND PELVIS WITH CONTRAST TECHNIQUE: Multidetector CT imaging of the abdomen and  pelvis was performed using the standard protocol following bolus administration of intravenous contrast. CONTRAST:  100mL OMNIPAQUE IOHEXOL 300 MG/ML  SOLN COMPARISON:  2017 FINDINGS: Lower chest: No acute abnormality. Hepatobiliary: Enhancing lesions previously characterized as hemangiomas. Cholecystectomy. No unexpected biliary dilatation. Pancreas: Unremarkable. Spleen: Slight increase in size of small low-attenuation splenic lesions, which are still likely benign. Adrenals/Urinary Tract: Adrenals are unremarkable. Small bilateral renal cysts and additional too small to characterize hypoattenuating lesions. Bladder is unremarkable. Stomach/Bowel: Bowel is normal in caliber with moderate stool burden. Vascular/Lymphatic: Aortic atherosclerosis. There are no enlarged lymph nodes identified. Reproductive: Status post hysterectomy. No adnexal masses. Other: No ascites.  No acute abnormality of the abdominal wall. Musculoskeletal: Stable vertebral body heights. No acute osseous abnormality. IMPRESSION: No acute abnormality or findings to account for reported symptoms. Electronically Signed   By: Guadlupe SpanishPraneil  Patel M.D.   On: 06/23/2020 12:12    Procedures Procedures (including critical care time)  Medications Ordered in ED Medications  haloperidol lactate (HALDOL) injection 5 mg (5 mg Intramuscular Given 06/23/20 2017)  LORazepam (ATIVAN) injection 1 mg (1 mg Intravenous Given 06/23/20 2347)  fentaNYL (SUBLIMAZE) injection 25 mcg (25 mcg Intravenous Given 06/23/20 2348)    ED Course  I have reviewed the triage vital signs and the nursing notes.  Pertinent labs & imaging results that were available during my care of the patient were reviewed by me and considered in my medical decision making (see chart for details).  Clinical Course as of Jun 24 5  [facility]  2245 Monocytes Relative: 7 [EK]    Clinical Course User Index [EK] Christella NoaKouri, Elyse R, Student-PA   MDM Rules/Calculators/A&P                           Patient presenting back to  the ED today after visit this morning for persisting upper abdominal pain with nausea and vomiting.  She is history of multiple abdominal surgeries including hysterectomy, cholecystectomy, appendectomy, laparoscopic gastrotomy with repair of hernia.  She states she has never had similar symptoms in the past.  Followed by [facility] for GI.  Negative CT scan earlier today.  Potassium was 5.4 and creatinine was slightly elevated.  She felt better prior to discharge however soon after arriving home symptoms returned and worsened.  No coffee-ground emesis or hematemesis.  No fevers or chills.  On exam she is in acute distress, unable to sit still, hyperventilating.  Appears very anxious.  Abdomen is soft with tenderness to the upper abdomen, no guarding or rebound is present.  Will repeat lab work to check electrolytes and kidney function.  Haldol ordered for intractable nausea vomiting.  Patient appeared to have interval improvement with Haldol however symptoms returned.  Dosed with additional Ativan and fentanyl.  Labs with calcium now 3.4, leukocytosis is slightly higher at 15.2.  EKG without new arrhythmia or ischemia.  On reevaluation she appears less anxious though reports persisting symptoms.  Given patient's persistent symptoms and lack of significant improvement multiple interventions, will admit for intractable nausea vomiting with epigastric abdominal pain.  Troponin added on and is negative.  UDS per hospitalist is also negative.  Patient has completed Covid vaccines in April, Covid test pending.  Patient agreeable with plan.  Final Clinical Impression(s) / ED Diagnoses Final diagnoses:  Intractable nausea and vomiting  Upper abdominal pain    Rx / DC Orders ED Discharge Orders    None       Jailine Lieder, [place] N, PA-C 06/24/20 0018    Milagros Loll, MD 06/26/20 1640

## 2020-06-25 DIAGNOSIS — I4510 Unspecified right bundle-branch block: Secondary | ICD-10-CM | POA: Diagnosis present

## 2020-06-25 DIAGNOSIS — K2950 Unspecified chronic gastritis without bleeding: Secondary | ICD-10-CM | POA: Diagnosis present

## 2020-06-25 DIAGNOSIS — Z980 Intestinal bypass and anastomosis status: Secondary | ICD-10-CM | POA: Diagnosis not present

## 2020-06-25 DIAGNOSIS — Z950 Presence of cardiac pacemaker: Secondary | ICD-10-CM | POA: Diagnosis not present

## 2020-06-25 DIAGNOSIS — K2900 Acute gastritis without bleeding: Secondary | ICD-10-CM | POA: Diagnosis present

## 2020-06-25 DIAGNOSIS — Z955 Presence of coronary angioplasty implant and graft: Secondary | ICD-10-CM | POA: Diagnosis not present

## 2020-06-25 DIAGNOSIS — K209 Esophagitis, unspecified without bleeding: Secondary | ICD-10-CM | POA: Diagnosis not present

## 2020-06-25 DIAGNOSIS — I2510 Atherosclerotic heart disease of native coronary artery without angina pectoris: Secondary | ICD-10-CM | POA: Diagnosis present

## 2020-06-25 DIAGNOSIS — R112 Nausea with vomiting, unspecified: Secondary | ICD-10-CM | POA: Diagnosis not present

## 2020-06-25 DIAGNOSIS — M1990 Unspecified osteoarthritis, unspecified site: Secondary | ICD-10-CM | POA: Diagnosis present

## 2020-06-25 DIAGNOSIS — Z20822 Contact with and (suspected) exposure to covid-19: Secondary | ICD-10-CM | POA: Diagnosis present

## 2020-06-25 DIAGNOSIS — Z8711 Personal history of peptic ulcer disease: Secondary | ICD-10-CM | POA: Diagnosis not present

## 2020-06-25 DIAGNOSIS — K2100 Gastro-esophageal reflux disease with esophagitis, without bleeding: Secondary | ICD-10-CM | POA: Diagnosis present

## 2020-06-25 DIAGNOSIS — R1010 Upper abdominal pain, unspecified: Secondary | ICD-10-CM | POA: Diagnosis present

## 2020-06-25 DIAGNOSIS — K289 Gastrojejunal ulcer, unspecified as acute or chronic, without hemorrhage or perforation: Secondary | ICD-10-CM | POA: Diagnosis not present

## 2020-06-25 DIAGNOSIS — Z86010 Personal history of colonic polyps: Secondary | ICD-10-CM | POA: Diagnosis not present

## 2020-06-25 DIAGNOSIS — G43909 Migraine, unspecified, not intractable, without status migrainosus: Secondary | ICD-10-CM | POA: Diagnosis present

## 2020-06-25 DIAGNOSIS — E860 Dehydration: Secondary | ICD-10-CM | POA: Diagnosis present

## 2020-06-25 DIAGNOSIS — E785 Hyperlipidemia, unspecified: Secondary | ICD-10-CM | POA: Diagnosis present

## 2020-06-25 DIAGNOSIS — E876 Hypokalemia: Secondary | ICD-10-CM | POA: Diagnosis present

## 2020-06-25 DIAGNOSIS — D72823 Leukemoid reaction: Secondary | ICD-10-CM | POA: Diagnosis present

## 2020-06-25 DIAGNOSIS — E039 Hypothyroidism, unspecified: Secondary | ICD-10-CM | POA: Diagnosis present

## 2020-06-25 DIAGNOSIS — F329 Major depressive disorder, single episode, unspecified: Secondary | ICD-10-CM | POA: Diagnosis present

## 2020-06-25 DIAGNOSIS — R1013 Epigastric pain: Secondary | ICD-10-CM | POA: Diagnosis not present

## 2020-06-25 DIAGNOSIS — F419 Anxiety disorder, unspecified: Secondary | ICD-10-CM | POA: Diagnosis present

## 2020-06-25 DIAGNOSIS — G4730 Sleep apnea, unspecified: Secondary | ICD-10-CM | POA: Diagnosis present

## 2020-06-25 DIAGNOSIS — I441 Atrioventricular block, second degree: Secondary | ICD-10-CM | POA: Diagnosis present

## 2020-06-25 DIAGNOSIS — Z9049 Acquired absence of other specified parts of digestive tract: Secondary | ICD-10-CM | POA: Diagnosis not present

## 2020-06-25 DIAGNOSIS — K3189 Other diseases of stomach and duodenum: Secondary | ICD-10-CM | POA: Diagnosis present

## 2020-06-25 DIAGNOSIS — R1084 Generalized abdominal pain: Secondary | ICD-10-CM | POA: Diagnosis not present

## 2020-06-25 LAB — COMPREHENSIVE METABOLIC PANEL
ALT: 19 U/L (ref 0–44)
AST: 26 U/L (ref 15–41)
Albumin: 3.8 g/dL (ref 3.5–5.0)
Alkaline Phosphatase: 71 U/L (ref 38–126)
Anion gap: 13 (ref 5–15)
BUN: 15 mg/dL (ref 8–23)
CO2: 18 mmol/L — ABNORMAL LOW (ref 22–32)
Calcium: 8.3 mg/dL — ABNORMAL LOW (ref 8.9–10.3)
Chloride: 105 mmol/L (ref 98–111)
Creatinine, Ser: 0.85 mg/dL (ref 0.44–1.00)
GFR calc Af Amer: >60 mL/min (ref >60)
GFR calc non Af Amer: >60 mL/min (ref >60)
Glucose, Bld: 83 mg/dL (ref 70–99)
Potassium: 3.4 mmol/L — ABNORMAL LOW (ref 3.5–5.1)
Sodium: 136 mmol/L (ref 135–145)
Total Bilirubin: 0.8 mg/dL (ref 0.3–1.2)
Total Protein: 6.6 g/dL (ref 6.5–8.1)

## 2020-06-25 LAB — CBC
HCT: 38.9 % (ref 36.0–46.0)
Hemoglobin: 12.4 g/dL (ref 12.0–15.0)
MCH: 29.7 pg (ref 26.0–34.0)
MCHC: 31.9 g/dL (ref 30.0–36.0)
MCV: 93.1 fL (ref 80.0–100.0)
Platelets: 279 10*3/uL (ref 150–400)
RBC: 4.18 MIL/uL (ref 3.87–5.11)
RDW: 12.9 % (ref 11.5–15.5)
WBC: 10.4 10*3/uL (ref 4.0–10.5)
nRBC: 0 % (ref 0.0–0.2)

## 2020-06-25 LAB — HIV ANTIBODY (ROUTINE TESTING W REFLEX): HIV Screen 4th Generation wRfx: NONREACTIVE

## 2020-06-25 MED ORDER — METOCLOPRAMIDE HCL 5 MG/ML IJ SOLN
5.0000 mg | Freq: Four times a day (QID) | INTRAMUSCULAR | Status: AC
  Administered 2020-06-25: 5 mg via INTRAVENOUS
  Filled 2020-06-25: qty 2

## 2020-06-25 MED ORDER — ONDANSETRON HCL 4 MG/2ML IJ SOLN
4.0000 mg | Freq: Four times a day (QID) | INTRAMUSCULAR | Status: DC | PRN
  Administered 2020-06-25 – 2020-06-28 (×5): 4 mg via INTRAVENOUS
  Filled 2020-06-25 (×5): qty 2

## 2020-06-25 MED ORDER — ASPIRIN EC 81 MG PO TBEC
81.0000 mg | DELAYED_RELEASE_TABLET | Freq: Every day | ORAL | Status: DC
  Administered 2020-06-25 – 2020-06-28 (×3): 81 mg via ORAL
  Filled 2020-06-25 (×3): qty 1

## 2020-06-25 MED ORDER — ONDANSETRON HCL 4 MG PO TABS
4.0000 mg | ORAL_TABLET | Freq: Four times a day (QID) | ORAL | Status: DC | PRN
  Administered 2020-06-27 (×2): 4 mg via ORAL
  Filled 2020-06-25 (×3): qty 1

## 2020-06-25 MED ORDER — PROMETHAZINE HCL 25 MG RE SUPP
12.5000 mg | Freq: Four times a day (QID) | RECTAL | Status: DC | PRN
  Administered 2020-06-26: 12.5 mg via RECTAL
  Filled 2020-06-25: qty 1

## 2020-06-25 NOTE — Progress Notes (Signed)
PROGRESS NOTE    Tiffany Villarreal  HWK:[id number] DOB: 1951-05-08 DOA: 06/23/2020 PCP: Bailey Mech, PA-C   Brief Narrative:  Tiffany Villarreal is a 69 y.o. female with a past medical history of coronary artery disease status post DES to LAD in July done at [facility], history of migraine headaches, history of second-degree heart block status post pacemaker, history of GI issues previously for which she has seen gastroenterology many years ago but not recently.  She has had upper endoscopy and colonoscopy many years ago.  She also has a history of hypothyroidism.  She presented to the emergency department on August 9 with complaints of nausea vomiting abdominal pain.  She was given symptomatic treatment.  She had a CT scan done which was unremarkable for any acute process.  She was discharged home.  She came back within a few hours with similar symptoms.  Despite treatment in the ED she did not feel better so she was hospitalized. She complains of 10 out of 10 pain in the upper abdomen radiating to the chest and to the lower abdomen.  Described as a burning sensation at times.  Associated with nausea and episodes of emesis.  Denies any blood in the emesis.  Last episode of vomiting was yesterday.  Has also been having loose stools.  She has a history of hemorrhoids and occasionally sees blood on the tissue paper.  Has not noticed any more bleeding than that.  Does admit to acid reflux but only takes Pepcid as needed.  Denies any shortness of breath.  She denies any fever or chills.  Has been having some dizziness and lightheadedness.  She apparently lost her balance and fell a week ago.  This is also not uncommon for her.  Denies any weakness on any 1 side of her body.  No episodes suggestive of seizure activity. She was noted to be mildly hypokalemic.  Troponin level was normal.  Due to lack of improvement she was hospitalized.   Assessment & Plan:   Principal Problem:   Acute  gastritis Active Problems:   Intractable nausea and vomiting   Upper abdominal pain   Cardiac pacemaker in situ   CAD (coronary artery disease)   Hypothyroidism   Acute on chronic intractable nausea without vomiting Rule out gastritis -Abdominal pain essentially resolved, only complains of nausea, refusing any p.o. today despite ongoing treatment with Phenergan  -CT abdomen pelvis unremarkable at admission -Patient reports 25+ year history of symptoms, follows outpatient GI at [facility], but has not seen them in "years"  -No other vomiting, diarrhea or constipation noted per the patient  -Concern for gastritis, continue Carafate, PPI and advance diet as tolerated  -Transition to IV and p.o. Zofran initially, Phenergan suppository only as needed for breakthrough nausea and vomiting  CAD, history of with obstruction  -LAD stent placed July 2021  -Continue on dual antiplatelet therapy with aspirin and Plavix, beta-blocker  -Recommend further discussion with cardiology and PCP outpatient for statin  History of AV block status post pacemaker: Stable.  History of hypothyroidism: Continue with levothyroxine.  Hypokalemia, mild:  Follow repeat labs. Check Mg.  Dehydration: Check labs today.  Continue with IV fluids.  Leukemoid reaction, resolved -likely hemoconcentration given poor p.o. intake as above  Rule out anxiety/depression Her medication list also shows that she is on Cymbalta, mirtazapine, Seroquel, Lexapro.   DVT Prophylaxis: SCDs for now Code Status: Full code Family Communication: Discussed with the patient  Status is: Inpatient  Dispo:  The patient is from: Home              Anticipated d/c is to: Home              Anticipated d/c date is: 24 hours              Patient currently not medically stable for discharge given patient is still unable to tolerate p.o. safely  Consultants:   None  Procedures:   None  Antimicrobials:   None  Subjective: No acute issues or events overnight, patient denies any further vomiting but indicates ongoing anxiety about tolerating p.o., refusing food as she is afraid it will cause nausea but currently is without symptoms.  Objective: Vitals:   06/24/20 2158 06/25/20 0551 06/25/20 0954 06/25/20 1425  BP: 117/61 (!) 125/59 123/60 (!) 122/59  Pulse: 78 80 76 78  Resp: 20 20    Temp: 99 F (37.2 C) 98.6 F (37 C)  97.9 F (36.6 C)  TempSrc: Oral Oral  Oral  SpO2: 99% 100%  96%  Weight:      Height:        Intake/Output Summary (Last 24 hours) at 06/25/2020 1449 Last data filed at 06/25/2020 1208 Gross per 24 hour  Intake 1826.11 ml  Output --  Net 1826.11 ml   Filed Weights   06/23/20 1735 06/24/20 1006  Weight: 68 kg 65.2 kg    Examination:  General exam: Appears calm and comfortable  Respiratory system: Clear to auscultation. Respiratory effort normal. Cardiovascular system: S1 & S2 heard, RRR. No JVD, murmurs, rubs, gallops or clicks. No pedal edema. Gastrointestinal system: Abdomen is nondistended, soft and nontender. No organomegaly or masses felt. Normal bowel sounds heard. Central nervous system: Alert and oriented. No focal neurological deficits. Extremities: Symmetric 5 x 5 power. Skin: No rashes, lesions or ulcers Psychiatry: Judgement and insight appear normal. Mood & affect appropriate.     Data Reviewed: I have personally reviewed following labs and imaging studies  CBC: Recent Labs  Lab 06/23/20 1036 06/23/20 2150 06/24/20 1118 06/25/20 0419  WBC 13.0* 15.2* 10.4 10.4  NEUTROABS  --  12.4*  --   --   HGB 14.4 13.2 12.3 12.4  HCT 42.8 38.5 38.9 38.9  MCV 85.6 85.2 93.3 93.1  PLT 457* 387 127* 279   Basic Metabolic Panel: Recent Labs  Lab 06/23/20 1036 06/23/20 2150 06/24/20 1118 06/25/20 0419  NA 140 138 139 136  K 5.4* 3.4* 3.6 3.4*  CL 103 106 108 105  CO2 22 18* 19* 18*  GLUCOSE 142* 145* 131* 83  BUN 18 20 20 15    CREATININE 1.27* 1.08* 0.86 0.85  CALCIUM 9.6 9.0 8.7* 8.3*  MG  --   --  2.1  --    GFR: Estimated Creatinine Clearance: 59.1 mL/min (by C-G formula based on SCr of 0.85 mg/dL). Liver Function Tests: Recent Labs  Lab 06/23/20 1036 06/24/20 1118 06/25/20 0419  AST 31 30 26   ALT 22 20 19   ALKPHOS 87 79 71  BILITOT 1.1 0.9 0.8  PROT 7.9 7.0 6.6  ALBUMIN 4.5 4.1 3.8   Recent Labs  Lab 06/23/20 1036 06/24/20 1118  LIPASE 32 28   No results for input(s): AMMONIA in the last 168 hours. Coagulation Profile: No results for input(s): INR, PROTIME in the last 168 hours. Cardiac Enzymes: No results for input(s): CKTOTAL, CKMB, CKMBINDEX, TROPONINI in the last 168 hours. BNP (last 3 results) No results for input(s): PROBNP in  the last 8760 hours. HbA1C: No results for input(s): HGBA1C in the last 72 hours. CBG: No results for input(s): GLUCAP in the last 168 hours. Lipid Profile: No results for input(s): CHOL, HDL, LDLCALC, TRIG, CHOLHDL, LDLDIRECT in the last 72 hours. Thyroid Function Tests: No results for input(s): TSH, T4TOTAL, FREET4, T3FREE, THYROIDAB in the last 72 hours. Anemia Panel: No results for input(s): VITAMINB12, FOLATE, FERRITIN, TIBC, IRON, RETICCTPCT in the last 72 hours. Sepsis Labs: No results for input(s): PROCALCITON, LATICACIDVEN in the last 168 hours.  Recent Results (from the past 240 hour(s))  SARS Coronavirus 2 by RT PCR (hospital order, performed in [facility] hospital lab) Nasopharyngeal Urine, Clean Catch     Status: None   Collection Time: 06/23/20 11:50 PM   Specimen: Urine, Clean Catch; Nasopharyngeal  Result Value Ref Range Status   SARS Coronavirus 2 NEGATIVE NEGATIVE Final    Comment: (NOTE) SARS-CoV-2 target nucleic acids are NOT DETECTED.  The SARS-CoV-2 RNA is generally detectable in upper and lower respiratory specimens during the acute phase of infection. The lowest concentration of SARS-CoV-2 viral copies this assay can detect  is 250 copies / mL. A negative result does not preclude SARS-CoV-2 infection and should not be used as the sole basis for treatment or other patient management decisions.  A negative result may occur with improper specimen collection / handling, submission of specimen other than nasopharyngeal swab, presence of viral mutation(s) within the areas targeted by this assay, and inadequate number of viral copies (<250 copies / mL). A negative result must be combined with clinical observations, patient history, and epidemiological information.  Fact Sheet for Patients:   BoilerBrush.com.cy  Fact Sheet for Healthcare Providers: https://pope.com/  This test is not yet approved or  cleared by the [place] FDA and has been authorized for detection and/or diagnosis of SARS-CoV-2 by FDA under an Emergency Use Authorization (EUA).  This EUA will remain in effect (meaning this test can be used) for the duration of the COVID-19 declaration under Section 564(b)(1) of the Act, 21 U.S.C. section 360bbb-3(b)(1), unless the authorization is terminated or revoked sooner.  Performed at [facility], [address], [place], [place] [address]          Radiology Studies: No results found.      Scheduled Meds: . aspirin EC  81 mg Oral Daily  . clopidogrel  75 mg Oral Daily  . DULoxetine  30 mg Oral Daily  . escitalopram  20 mg Oral Daily  . levothyroxine  50 mcg Oral Daily  . metoprolol succinate  100 mg Oral Daily  . mirtazapine  15 mg Oral QHS  . pantoprazole (PROTONIX) IV  40 mg Intravenous Q12H  . QUEtiapine  50 mg Oral BID  . sucralfate  1 g Oral Q6H   Continuous Infusions:   LOS: 0 days   Time spent: 40 min  Azucena Fallen, DO [facility]  If 7PM-7AM, please contact night-coverage [email or web address]  06/25/2020, 2:49 PM

## 2020-06-26 DIAGNOSIS — R1013 Epigastric pain: Secondary | ICD-10-CM

## 2020-06-26 DIAGNOSIS — R112 Nausea with vomiting, unspecified: Secondary | ICD-10-CM

## 2020-06-26 MED ORDER — POTASSIUM CHLORIDE 2 MEQ/ML IV SOLN
INTRAVENOUS | Status: AC
  Administered 2020-06-27: 200 mL via INTRAVENOUS
  Filled 2020-06-26 (×4): qty 1000

## 2020-06-26 MED ORDER — DICYCLOMINE HCL 20 MG PO TABS
20.0000 mg | ORAL_TABLET | Freq: Three times a day (TID) | ORAL | Status: DC
  Administered 2020-06-26 – 2020-06-28 (×4): 20 mg via ORAL
  Filled 2020-06-26 (×9): qty 1

## 2020-06-26 NOTE — Progress Notes (Signed)
PROGRESS NOTE    Tiffany Villarreal  BHA:[id number] DOB: 11-13-1951 DOA: 06/23/2020 PCP: Bailey Mech, PA-C   Chief Complaint  Patient presents with  . Nausea    Brief Narrative:  Tiffany Villarreal 69 y.o.femalewith Tiffany Villarreal past medical history of coronary artery disease status postDESto LAD in July done at [facility], history of migraine headaches, history of second-degree heart block status post pacemaker, history of GI issues previously for which she has seen gastroenterology many years ago but not recently. She has had upper endoscopy and colonoscopy many years ago. She also has Cope Marte history of hypothyroidism. She presented to the emergency department on August 9 with complaints of nausea vomiting abdominal pain. She was given symptomatic treatment. She had Aarohi Redditt CT scan done which was unremarkable for any acute process. She was discharged home. She came back within [place] Ponder few hours with similar symptoms. Despite treatment in the ED she did not feel better so she was hospitalized. She complains of 10 out of 10 pain in the upper abdomen radiating to the chest and to the lower abdomen. Described as Aarian Cleaver burning sensation at times. Associated with nausea and episodes of emesis. Denies any blood in the emesis. Last episode ofvomiting was yesterday. Has also been having loose stools. She has Diannie Willner history of hemorrhoids and occasionally sees blood on the tissue paper. Has not noticed any more bleeding than that. Does admit to acid reflux but only takes Pepcid as needed. Denies any shortness of breath. She denies any fever or chills. Has been having some dizziness and lightheadedness. She apparently lost her balance and fell Debbie Yearick week ago. This is also not uncommon for her. Denies any weakness on any 1 side of her body. No episodes suggestive of seizure activity. She was noted to be mildly hypokalemic. Troponin level was normal. Due to lack of improvement she was  hospitalized.   Assessment & Plan:   Principal Problem:   Acute gastritis Active Problems:   Intractable nausea and vomiting   Upper abdominal pain   Cardiac pacemaker in situ   CAD (coronary artery disease)   Hypothyroidism   Non-intractable vomiting with nausea   Acute on chronic intractable nausea without vomiting Rule out gastritis Hx Billroth 1 Anastomosis for PUD -continued abdominal discomfort and nausea, tolerating only sips of ginger ale -no acute abnormality on CT scan -continue PPI and carafate  -given lack of improvement today, will discuss with GI -planning for endoscopy tomorrow per note -continue bentyl, zofran, phenergan, PPI BID, carafate  CAD, history of with obstruction  -LAD stent placed July 2021  -Continue on dual antiplatelet therapy with aspirin and Plavix, beta-blocker  -Recommend further discussion with cardiology and PCP outpatient for statin  History of AV block status post pacemaker: Stable.  History of hypothyroidism: Continue with levothyroxine.  Hypokalemia, mild:  Follow repeat labs. Check Mg.  Dehydration: improved  Leukemoid reaction, resolved  anxiety/depression Cymbalta, mirtazapine, Seroquel, Lexapro.  DVT prophylaxis: SCD Code Status: full  Family Communication: none at bedside Disposition:   Status is: Inpatient  Remains inpatient appropriate because:Inpatient level of care appropriate due to severity of illness   Dispo: The patient is from: Home              Anticipated d/c is to: Home              Anticipated d/c date is: 2 days              Patient currently is not medically stable  to d/c.  Consultants:   Gastroenterology  Procedures:   none  Antimicrobials:  Anti-infectives (From admission, onward)   None     Subjective: C/o continued nausea, abdominal discomfort  Objective: Vitals:   06/25/20 0954 06/25/20 1425 06/25/20 2052 06/26/20 1403  BP: 123/60 (!) 122/59 128/74 120/76  Pulse: 76  78 73 72  Resp:   18 14  Temp:  97.9 F (36.6 C) 99.1 F (37.3 C) 98.6 F (37 C)  TempSrc:  Oral Oral Oral  SpO2:  96% 100% 99%  Weight:      Height:        Intake/Output Summary (Last 24 hours) at 06/26/2020 1747 Last data filed at 06/26/2020 0600 Gross per 24 hour  Intake 270 ml  Output --  Net 270 ml   Filed Weights   06/23/20 1735 06/24/20 1006  Weight: 68 kg 65.2 kg    Examination:  General exam: Appears calm and comfortable  Respiratory system: Clear to auscultation. Respiratory effort normal. Cardiovascular system: S1 & S2 heard, RRR.  Gastrointestinal system: Abdomen is nondistended, soft and nontender.  Central nervous system: Alert and oriented. No focal neurological deficits. Extremities: no LEE Skin: No rashes, lesions or ulcers Psychiatry: Judgement and insight appear normal. Mood & affect appropriate.     Data Reviewed: I have personally reviewed following labs and imaging studies  CBC: Recent Labs  Lab 06/23/20 1036 06/23/20 2150 06/24/20 1118 06/25/20 0419  WBC 13.0* 15.2* 10.4 10.4  NEUTROABS  --  12.4*  --   --   HGB 14.4 13.2 12.3 12.4  HCT 42.8 38.5 38.9 38.9  MCV 85.6 85.2 93.3 93.1  PLT 457* 387 127* 279    Basic Metabolic Panel: Recent Labs  Lab 06/23/20 1036 06/23/20 2150 06/24/20 1118 06/25/20 0419  NA 140 138 139 136  K 5.4* 3.4* 3.6 3.4*  CL 103 106 108 105  CO2 22 18* 19* 18*  GLUCOSE 142* 145* 131* 83  BUN 18 20 20 15   CREATININE 1.27* 1.08* 0.86 0.85  CALCIUM 9.6 9.0 8.7* 8.3*  MG  --   --  2.1  --     GFR: Estimated Creatinine Clearance: 59.1 mL/min (by C-G formula based on SCr of 0.85 mg/dL).  Liver Function Tests: Recent Labs  Lab 06/23/20 1036 06/24/20 1118 06/25/20 0419  AST 31 30 26   ALT 22 20 19   ALKPHOS 87 79 71  BILITOT 1.1 0.9 0.8  PROT 7.9 7.0 6.6  ALBUMIN 4.5 4.1 3.8    CBG: No results for input(s): GLUCAP in the last 168 hours.   Recent Results (from the past 240 hour(s))  SARS  Coronavirus 2 by RT PCR (hospital order, performed in [facility] hospital lab) Nasopharyngeal Urine, Clean Catch     Status: None   Collection Time: 06/23/20 11:50 PM   Specimen: Urine, Clean Catch; Nasopharyngeal  Result Value Ref Range Status   SARS Coronavirus 2 NEGATIVE NEGATIVE Final    Comment: (NOTE) SARS-CoV-2 target nucleic acids are NOT DETECTED.  The SARS-CoV-2 RNA is generally detectable in upper and lower respiratory specimens during the acute phase of infection. The lowest concentration of SARS-CoV-2 viral copies this assay can detect is 250 copies / mL. Malay Fantroy negative result does not preclude SARS-CoV-2 infection and should not be used as the sole basis for treatment or other patient management decisions.  Tifanie Gardiner negative result may occur with improper specimen collection / handling, submission of specimen other than nasopharyngeal swab, presence of viral mutation(s)  within the areas targeted by this assay, and inadequate number of viral copies (<250 copies / mL). Delaila Nand negative result must be combined with clinical observations, patient history, and epidemiological information.  Fact Sheet for Patients:   BoilerBrush.com.cy  Fact Sheet for Healthcare Providers: https://pope.com/  This test is not yet approved or  cleared by the [place] FDA and has been authorized for detection and/or diagnosis of SARS-CoV-2 by FDA under an Emergency Use Authorization (EUA).  This EUA will remain in effect (meaning this test can be used) for the duration of the COVID-19 declaration under Section 564(b)(1) of the Act, 21 U.S.C. section 360bbb-3(b)(1), unless the authorization is terminated or revoked sooner.  Performed at [facility], [address] [place], [place] [address]          Radiology Studies: No results found.      Scheduled Meds: . aspirin EC  81 mg Oral Daily  . clopidogrel  75 mg Oral Daily  .  dicyclomine  20 mg Oral TID AC & HS  . DULoxetine  30 mg Oral Daily  . escitalopram  20 mg Oral Daily  . levothyroxine  50 mcg Oral Daily  . metoprolol succinate  100 mg Oral Daily  . mirtazapine  15 mg Oral QHS  . pantoprazole (PROTONIX) IV  40 mg Intravenous Q12H  . QUEtiapine  50 mg Oral BID  . sucralfate  1 g Oral Q6H   Continuous Infusions:   LOS: 1 day    Time spent: over 30 min    Lacretia Nicks, MD [facility]   To contact the attending provider between 7A-7P or the covering provider during after hours 7P-7A, please log into the web site [email or web address] and access using universal [place] password for that web site. If you do not have the password, please call the hospital operator.  06/26/2020, 5:47 PM

## 2020-06-26 NOTE — H&P (View-Only) (Signed)
Referring Provider:  [facility]         Primary Care Physician:  Bailey Mech, PA-C Primary Gastroenterologist: unassigned            We were asked to see this patient for:   Abdominal pain                ASSESSMENT /  PLAN    Tiffany Villarreal is a 69 y.o. female PMH significant for, but not necessarily limited to, hypertension, CAD, AV block status post pacemaker,  sleep apnea, OA, interstitial cystitis, hypothyroidism,   hyperlipidemia, migraines, depression, schizoaffective disorder,  chronic back pain,    cholecystectomy, appendectomy, hysterectomy, PUD/Billroth I, iron deficiency anemia, B12 deficiency, chronic gastritis.                                                                                                                         # Mid upper abdominal pain / nausea / vomiting in patient with hx of Bilroth I for PUD --Similar pain in 2017-2018 with extensive negative workup with [facility]. Pain felt to be functional at that time and resolved with Bentyl and anti-emetics --Symptoms had resolved, now recurrent over last two weeks.  --CT scan w/ contrast , labs this admission unremarkable.  --For further evaluation will schedule for EGD to be done tomorrow. The risks and benefits of EGD were discussed and the patient agrees to proceed.  --Bentyl helped in past, will resume it.   HPI:    Chief Complaint: Abdominal pain  Tiffany Villarreal is a 69 y.o. female with multiple medical problems including a history of Billroth I for PUD . She presented to the ED 06/23/2020 for evaluation of generalized abdominal pain, nausea, vomiting of non-bloody emesis. She recently had some loose stool but that resolved and hasn't had a BM since Sunday. It isn't unusual for her to have loose stool sometimes.   In the ED her CT scan was unremarkable.  Patient was discharged but returned within a few hours with similar symptoms.  Her labs were unremarkable including CBC, CMET and  lipase.  Troponin normal.  UDS negative  Patient was worked up extensively at [facility] a couple years ago for similar symptoms.  Work-up was negative.  At that time the pain responded to Bentyl plus PPI. Nausea and vomiting responded to Meclizine and Promethazine. Symptoms eventually subsided. She did well up until 2 weeks ago   The pain started acutely and she describes it as constant but exacerbated by p.o. intake as well as twisting to the left.  The pain has awoken her from sleep at night.  It radiates through to her back and feels like something is pounding her in the area but also feels sore and bruised.  No NSAID use.  No fever.  PREVIOUS ENDOSCOPIC EVALUATIONS / GI STUDIES :  06/23/20 CT scan  abd/ pelvis with contrast -no acute findings   MRCP Sept 2017 at [facility] CLINICAL DATA:  Indeterminate renal and splenic lesions on prior CT.   EXAM:  MRI ABDOMEN WITHOUT AND WITH CONTRAST (INCLUDING MRCP)  IMPRESSION:  1. Enhancing lesions in the liver are most consistentbenign  hemangiomas.  2. Lesions within the spleen are nonspecific but favored benign in  the absence of known malignancy.  3. Benign renal cysts.  4. Normal biliary tree.     Sept 2017 EGD / Colonoscopy at [facility]  Actual report cannot be seen, path below Final Diagnosis    A. SMALL INTESTINE, LABELED "JEJUNUM", ENDOSCOPIC BIOPSY:              FRAGMENTS OF UNREMARKABLE SMALL INTESTINAL MUCOSA.              NO EVIDENCE OF CELIAC DISEASE.  B. STOMACH, ENDOSCOPIC BIOPSY:              CHRONIC GASTRITIS.              NEGATIVE FOR HELICOBACTER ORGANISMS PER THE SPECIAL STAIN.  C. LARGE INTESTINE, RANDOM RIGHT, ENDOSCOPIC BIOPSY:              FRAGMENTS OF UNREMARKABLE COLONIC MUCOSA.              NO EVIDENCE OF COLITIS.  D. LARGE INTESTINE, RANDOM LEFT, ENDOSCOPIC BIOPSY:              FRAGMENTS OF UNREMARKABLE COLONIC MUCOSA.              NO EVIDENCE OF COLITIS.  P305 4, 312     Past Medical  History:  Diagnosis Date  . Anemia    takes Iron caps daily  . Anxiety    takes Remeron and Adderall  . Arthritis   . Back pain   . Chronic back pain   . Depression    takes Buspar,Klonopin,and Sertraline daily  . Diarrhea    since stomach surgeries  . Dizziness    related spine   . Dysrhythmia    takes Metoprolol daily  . History of blood transfusion    no abnormal reaction noted  . History of bronchitis    recently and was on and has completed an antibiotic  . History of colon polyps   . History of migraine    last one a few wks ago and takes ZOmig prn  . Hyperlipidemia    takes Livalo daily  . Hypothyroidism    but doesn't require meds  . Interstitial cystitis    takes Uribel and Elmiron bid   . Muscle spasms of head and/or neck    takes Zanaflex prn  . Osteoarthritis   . Peripheral edema   . Pneumonia    last time a few yrs ago  . PONV (postoperative nausea and vomiting)    extreme vomiting;pt states she had to have a picc line  after 2 surgeries  . Sleep apnea    uses CPAP  . Urinary frequency   . Urinary urgency     Past Surgical History:  Procedure Laterality Date  . ABDOMINAL HYSTERECTOMY  1988  . APPENDECTOMY  1970  . bladder tumor removed  2002   benign  . ceasarian    . CHOLECYSTECTOMY  2010  . COLONOSCOPY    . ESOPHAGOGASTRODUODENOSCOPY    . LAPAROSCOPIC GASTROTOMY W/ REPAIR OF ULCER    . THORACIC DISCECTOMY  10/30/2012   Procedure: THORACIC DISCECTOMY;  Surgeon: Temple Pacini, MD;  Location: [facility] NEURO ORS;  Service: Neurosurgery;  Laterality: Right;  Dr. Hendrickson to perform Right thoracotomy for exposure  . THORACIC EXPOSURE  10/30/2012   Procedure: THORACIC EXPOSURE;  Surgeon: Steven C Hendrickson, MD;  Location: [facility] NEURO ORS;  Service: Thoracic;  Laterality: Right;  Thoracic Exposure  . TONSILLECTOMY  1969    Prior to Admission medications   Medication Sig Start Date End Date Taking? Authorizing Provider  acetaminophen (TYLENOL) 325 MG  tablet Take 650 mg by mouth every 6 (six) hours as needed for mild pain or headache.   Yes [provider]  ASPIRIN LOW DOSE 81 MG EC tablet Take 81 mg by mouth daily. 05/29/20  Yes [provider]  clopidogrel (PLAVIX) 75 MG tablet Take 75 mg by mouth daily. 05/29/20  Yes [provider]  hydrOXYzine (ATARAX/VISTARIL) 25 MG tablet Take 12.5-25 mg by mouth 3 (three) times daily as needed for anxiety.  05/20/20  Yes [provider]  levothyroxine (SYNTHROID) 50 MCG tablet Take 50 mcg by mouth daily. 06/16/20  Yes [provider]  metoprolol succinate (TOPROL-XL) 100 MG 24 hr tablet Take 100 mg by mouth 2 (two) times daily. 05/30/20  Yes [provider]  potassium chloride (KLOR-CON) 10 MEQ tablet Take 10 mEq by mouth daily. 02/07/20  Yes [provider]  promethazine (PHENERGAN) 25 MG tablet Take 1 tablet (25 mg total) by mouth every 6 (six) hours as needed for nausea or vomiting. 06/23/20  Yes Butler, Michael C, MD  QUEtiapine (SEROQUEL) 50 MG tablet Take 100 mg by mouth daily.  06/17/20  Yes [provider]    Current Facility-Administered Medications  Medication Dose Route Frequency Provider Last Rate Last Admin  . acetaminophen (TYLENOL) tablet 650 mg  650 mg Oral Q6H PRN Krishnan, Gokul, MD   650 mg at 06/26/20 0931   Or  . acetaminophen (TYLENOL) suppository 650 mg  650 mg Rectal Q6H PRN Krishnan, Gokul, MD      . aspirin EC tablet 81 mg  81 mg Oral Daily Krishnan, Gokul, MD   81 mg at 06/26/20 0932  . clopidogrel (PLAVIX) tablet 75 mg  75 mg Oral Daily Krishnan, Gokul, MD   75 mg at 06/26/20 0931  . DULoxetine (CYMBALTA) DR capsule 30 mg  30 mg Oral Daily Krishnan, Gokul, MD   30 mg at 06/26/20 0931  . escitalopram (LEXAPRO) tablet 20 mg  20 mg Oral Daily Krishnan, Gokul, MD   20 mg at 06/26/20 0932  . levothyroxine (SYNTHROID) tablet 50 mcg  50 mcg Oral Daily Krishnan, Gokul, MD   50 mcg at 06/26/20 0612  . metoprolol succinate  (TOPROL-XL) 24 hr tablet 100 mg  100 mg Oral Daily Krishnan, Gokul, MD   100 mg at 06/26/20 0932  . mirtazapine (REMERON) tablet 15 mg  15 mg Oral QHS Krishnan, Gokul, MD   15 mg at 06/25/20 2120  . ondansetron (ZOFRAN) injection 4 mg  4 mg Intravenous Q6H PRN Lancaster, William C, MD   4 mg at 06/26/20 0919  . ondansetron (ZOFRAN) tablet 4 mg  4 mg Oral Q6H PRN Lancaster, William C, MD      . pantoprazole (PROTONIX) injection 40 mg  40 mg Intravenous Q12H Krishnan, Gokul, MD   40 mg at 06/26/20 0931  . promethazine (PHENERGAN) suppository 12.5 mg  12.5 mg Rectal Q6H PRN Lancaster, William C, MD      . QUEtiapine (SEROQUEL) tablet 50 mg  50 mg Oral BID Krishnan, Gokul, MD   50 mg at 06/26/20 0931  . sucralfate (  CARAFATE) 1 GM/10ML suspension 1 g  1 g Oral Q6H Osvaldo ShipperKrishnan, Gokul, MD   1 g at 06/26/20 1313    Allergies as of 06/23/2020 - Review Complete 06/23/2020  Allergen Reaction Noted  . Dilaudid [hydromorphone hcl] Anaphylaxis and Other (See Comments) 10/09/2012  . Hydromorphone Anaphylaxis, Other (See Comments), and Rash 11/26/2011  . Morphine Swelling 11/26/2011  . Morphine and related Swelling, Anaphylaxis, Nausea And Vomiting, and Rash 11/26/2011  . Tetanus toxoids Other (See Comments) and Rash 11/26/2011  . Prochlorperazine Rash 11/26/2011  . Compazine [prochlorperazine edisylate] Other (See Comments) 10/09/2012    No family history on file.  Social History   Socioeconomic History  . Marital status: Married    Spouse name: Not on file  . Number of children: Not on file  . Years of education: Not on file  . Highest education level: Not on file  Occupational History  . Not on file  Tobacco Use  . Smoking status: Never Smoker  . Smokeless tobacco: Never Used  Substance and Sexual Activity  . Alcohol use: No  . Drug use: No  . Sexual activity: Yes    Birth control/protection: Surgical  Other Topics Concern  . Not on file  Social History Narrative  . Not on file   Social  Determinants of Health   Financial Resource Strain:   . Difficulty of Paying Living Expenses:   Food Insecurity:   . Worried About Programme researcher, broadcasting/film/videounning Out of Food in the Last Year:   . Baristaan Out of Food in the Last Year:   Transportation Needs:   . Freight forwarderLack of Transportation (Medical):   Marland Kitchen. Lack of Transportation (Non-Medical):   Physical Activity:   . Days of Exercise per Week:   . Minutes of Exercise per Session:   Stress:   . Feeling of Stress :   Social Connections:   . Frequency of Communication with Friends and Family:   . Frequency of Social Gatherings with Friends and Family:   . Attends Religious Services:   . Active Member of Clubs or Organizations:   . Attends BankerClub or Organization Meetings:   Marland Kitchen. Marital Status:   Intimate Partner Violence:   . Fear of Current or Ex-Partner:   . Emotionally Abused:   Marland Kitchen. Physically Abused:   . Sexually Abused:     Review of Systems: All systems reviewed and negative except where noted in HPI.  Physical Exam: Vital signs in last 24 hours: Temp:  [98.6 F (37 C)-99.1 F (37.3 C)] 98.6 F (37 C) (08/12 1403) Pulse Rate:  [72-73] 72 (08/12 1403) Resp:  [14-18] 14 (08/12 1403) BP: (120-128)/(74-76) 120/76 (08/12 1403) SpO2:  [99 %-100 %] 99 % (08/12 1403) Last BM Date: 06/22/20 General:   Alert, well-developed,  female in NAD Psych:  Pleasant, cooperative. Normal mood and affect. Eyes:  Pupils equal, sclera clear, no icterus.   Conjunctiva pink. Ears:  Normal auditory acuity. Nose:  No deformity, discharge,  or lesions. Neck:  Supple; no masses Lungs:  Clear throughout to auscultation.   No wheezes, crackles, or rhonchi.  Heart:  Regular rate and rhythm;  no lower extremity edema Abdomen:  Soft, non-distended, nontender, BS active, no palp mass   Rectal:  Deferred  Msk:  Symmetrical without gross deformities. . Neurologic:  Alert and  oriented x4;  grossly normal neurologically. Skin:  Intact without significant lesions or  rashes.   Intake/Output from previous day: 08/11 0701 - 08/12 0700 In: 834.1 [P.O.:270; I.V.:564.1] Out: -  Intake/Output this shift: No intake/output data recorded.  Lab Results: Recent Labs    06/23/20 2150 06/24/20 1118 06/25/20 0419  WBC 15.2* 10.4 10.4  HGB 13.2 12.3 12.4  HCT 38.5 38.9 38.9  PLT 387 127* 279   BMET Recent Labs    06/23/20 2150 06/24/20 1118 06/25/20 0419  NA 138 139 136  K 3.4* 3.6 3.4*  CL 106 108 105  CO2 18* 19* 18*  GLUCOSE 145* 131* 83  BUN 20 20 15   CREATININE 1.08* 0.86 0.85  CALCIUM 9.0 8.7* 8.3*   LFT Recent Labs    06/25/20 0419  PROT 6.6  ALBUMIN 3.8  AST 26  ALT 19  ALKPHOS 71  BILITOT 0.8   PT/INR No results for input(s): LABPROT, INR in the last 72 hours. Hepatitis Panel No results for input(s): HEPBSAG, HCVAB, HEPAIGM, HEPBIGM in the last 72 hours.   . CBC Latest Ref Rng & Units 06/25/2020 06/24/2020 06/23/2020  WBC 4.0 - 10.5 K/uL 10.4 10.4 15.2(H)  Hemoglobin 12.0 - 15.0 g/dL 08/23/2020 94.5 03.8  Hematocrit 36 - 46 % 38.9 38.9 38.5  Platelets 150 - 400 K/uL 279 127(L) 387    . CMP Latest Ref Rng & Units 06/25/2020 06/24/2020 06/23/2020  Glucose 70 - 99 mg/dL 83 08/23/2020) 800(L)  BUN 8 - 23 mg/dL 15 20 20   Creatinine 0.44 - 1.00 mg/dL 491(P 9.15)  Sodium 135 - 145 mmol/L 136 139 138  Potassium 3.5 - 5.1 mmol/L 3.4(L) 3.6 3.4(L)  Chloride 98 - 111 mmol/L 105 108 106  CO2 22 - 32 mmol/L 18(L) 19(L) 18(L)  Calcium 8.9 - 10.3 mg/dL 8.3(L) 8.7(L) 9.0  Total Protein 6.5 - 8.1 g/dL 6.6 7.0 -  Total Bilirubin 0.3 - 1.2 mg/dL 0.8 0.9 -  Alkaline Phos 38 - 126 U/L 71 79 -  AST 15 - 41 U/L 26 30 -  ALT 0 - 44 U/L 19 20 -   Studies/Results: No results found.  Principal Problem:   Acute gastritis Active Problems:   Intractable nausea and vomiting   Upper abdominal pain   Cardiac pacemaker in situ   CAD (coronary artery disease)   Hypothyroidism   Non-intractable vomiting with nausea    0.56, NP-C @   06/26/2020, 3:27 PM

## 2020-06-26 NOTE — Consult Note (Signed)
Referring Provider:  [facility]         Primary Care Physician:  Bailey Mech, PA-C Primary Gastroenterologist: unassigned            We were asked to see this patient for:   Abdominal pain                ASSESSMENT /  PLAN    Tiffany Villarreal is a 69 y.o. female PMH significant for, but not necessarily limited to, hypertension, CAD, AV block status post pacemaker,  sleep apnea, OA, interstitial cystitis, hypothyroidism,   hyperlipidemia, migraines, depression, schizoaffective disorder,  chronic back pain,    cholecystectomy, appendectomy, hysterectomy, PUD/Billroth I, iron deficiency anemia, B12 deficiency, chronic gastritis.                                                                                                                         # Mid upper abdominal pain / nausea / vomiting in patient with hx of Bilroth I for PUD --Similar pain in 2017-2018 with extensive negative workup with [facility]. Pain felt to be functional at that time and resolved with Bentyl and anti-emetics --Symptoms had resolved, now recurrent over last two weeks.  --CT scan w/ contrast , labs this admission unremarkable.  --For further evaluation will schedule for EGD to be done tomorrow. The risks and benefits of EGD were discussed and the patient agrees to proceed.  --Bentyl helped in past, will resume it.   HPI:    Chief Complaint: Abdominal pain  Tiffany Villarreal is a 69 y.o. female with multiple medical problems including a history of Billroth I for PUD . She presented to the ED 06/23/2020 for evaluation of generalized abdominal pain, nausea, vomiting of non-bloody emesis. She recently had some loose stool but that resolved and hasn't had a BM since Sunday. It isn't unusual for her to have loose stool sometimes.   In the ED her CT scan was unremarkable.  Patient was discharged but returned within a few hours with similar symptoms.  Her labs were unremarkable including CBC, CMET and  lipase.  Troponin normal.  UDS negative  Patient was worked up extensively at [facility] a couple years ago for similar symptoms.  Work-up was negative.  At that time the pain responded to Bentyl plus PPI. Nausea and vomiting responded to Meclizine and Promethazine. Symptoms eventually subsided. She did well up until 2 weeks ago   The pain started acutely and she describes it as constant but exacerbated by p.o. intake as well as twisting to the left.  The pain has awoken her from sleep at night.  It radiates through to her back and feels like something is pounding her in the area but also feels sore and bruised.  No NSAID use.  No fever.  PREVIOUS ENDOSCOPIC EVALUATIONS / GI STUDIES :  06/23/20 CT scan  abd/ pelvis with contrast -no acute findings   MRCP Sept 2017 at [facility] CLINICAL DATA:  Indeterminate renal and splenic lesions on prior CT.   EXAM:  MRI ABDOMEN WITHOUT AND WITH CONTRAST (INCLUDING MRCP)  IMPRESSION:  1. Enhancing lesions in the liver are most consistentbenign  hemangiomas.  2. Lesions within the spleen are nonspecific but favored benign in  the absence of known malignancy.  3. Benign renal cysts.  4. Normal biliary tree.     Sept 2017 EGD / Colonoscopy at [facility]  Actual report cannot be seen, path below Final Diagnosis    A. SMALL INTESTINE, LABELED "JEJUNUM", ENDOSCOPIC BIOPSY:              FRAGMENTS OF UNREMARKABLE SMALL INTESTINAL MUCOSA.              NO EVIDENCE OF CELIAC DISEASE.  B. STOMACH, ENDOSCOPIC BIOPSY:              CHRONIC GASTRITIS.              NEGATIVE FOR HELICOBACTER ORGANISMS PER THE SPECIAL STAIN.  C. LARGE INTESTINE, RANDOM RIGHT, ENDOSCOPIC BIOPSY:              FRAGMENTS OF UNREMARKABLE COLONIC MUCOSA.              NO EVIDENCE OF COLITIS.  D. LARGE INTESTINE, RANDOM LEFT, ENDOSCOPIC BIOPSY:              FRAGMENTS OF UNREMARKABLE COLONIC MUCOSA.              NO EVIDENCE OF COLITIS.  P305 4, 312     Past Medical  History:  Diagnosis Date  . Anemia    takes Iron caps daily  . Anxiety    takes Remeron and Adderall  . Arthritis   . Back pain   . Chronic back pain   . Depression    takes Buspar,Klonopin,and Sertraline daily  . Diarrhea    since stomach surgeries  . Dizziness    related spine   . Dysrhythmia    takes Metoprolol daily  . History of blood transfusion    no abnormal reaction noted  . History of bronchitis    recently and was on and has completed an antibiotic  . History of colon polyps   . History of migraine    last one a few wks ago and takes ZOmig prn  . Hyperlipidemia    takes Livalo daily  . Hypothyroidism    but doesn't require meds  . Interstitial cystitis    takes Uribel and Elmiron bid   . Muscle spasms of head and/or neck    takes Zanaflex prn  . Osteoarthritis   . Peripheral edema   . Pneumonia    last time a few yrs ago  . PONV (postoperative nausea and vomiting)    extreme vomiting;pt states she had to have a picc line  after 2 surgeries  . Sleep apnea    uses CPAP  . Urinary frequency   . Urinary urgency     Past Surgical History:  Procedure Laterality Date  . ABDOMINAL HYSTERECTOMY  1988  . APPENDECTOMY  1970  . bladder tumor removed  2002   benign  . ceasarian    . CHOLECYSTECTOMY  2010  . COLONOSCOPY    . ESOPHAGOGASTRODUODENOSCOPY    . LAPAROSCOPIC GASTROTOMY W/ REPAIR OF ULCER    . THORACIC DISCECTOMY  10/30/2012   Procedure: THORACIC DISCECTOMY;  Surgeon: Temple Pacini, MD;  Location: [facility] NEURO ORS;  Service: Neurosurgery;  Laterality: Right;  Dr. Dorris Fetch to perform Right thoracotomy for exposure  . THORACIC EXPOSURE  10/30/2012   Procedure: THORACIC EXPOSURE;  Surgeon: Loreli Slot, MD;  Location: [facility] NEURO ORS;  Service: Thoracic;  Laterality: Right;  Thoracic Exposure  . TONSILLECTOMY  1969    Prior to Admission medications   Medication Sig Start Date End Date Taking? Authorizing Provider  acetaminophen (TYLENOL) 325 MG  tablet Take 650 mg by mouth every 6 (six) hours as needed for mild pain or headache.   Yes [provider]  ASPIRIN LOW DOSE 81 MG EC tablet Take 81 mg by mouth daily. 05/29/20  Yes [provider]  clopidogrel (PLAVIX) 75 MG tablet Take 75 mg by mouth daily. 05/29/20  Yes [provider]  hydrOXYzine (ATARAX/VISTARIL) 25 MG tablet Take 12.5-25 mg by mouth 3 (three) times daily as needed for anxiety.  05/20/20  Yes [provider]  levothyroxine (SYNTHROID) 50 MCG tablet Take 50 mcg by mouth daily. 06/16/20  Yes [provider]  metoprolol succinate (TOPROL-XL) 100 MG 24 hr tablet Take 100 mg by mouth 2 (two) times daily. 05/30/20  Yes [provider]  potassium chloride (KLOR-CON) 10 MEQ tablet Take 10 mEq by mouth daily. 02/07/20  Yes [provider]  promethazine (PHENERGAN) 25 MG tablet Take 1 tablet (25 mg total) by mouth every 6 (six) hours as needed for nausea or vomiting. 06/23/20  Yes Terrilee Files, MD  QUEtiapine (SEROQUEL) 50 MG tablet Take 100 mg by mouth daily.  06/17/20  Yes [provider]    Current Facility-Administered Medications  Medication Dose Route Frequency Provider Last Rate Last Admin  . acetaminophen (TYLENOL) tablet 650 mg  650 mg Oral Q6H PRN Osvaldo Shipper, MD   650 mg at 06/26/20 0931   Or  . acetaminophen (TYLENOL) suppository 650 mg  650 mg Rectal Q6H PRN Osvaldo Shipper, MD      . aspirin EC tablet 81 mg  81 mg Oral Daily Osvaldo Shipper, MD   81 mg at 06/26/20 0932  . clopidogrel (PLAVIX) tablet 75 mg  75 mg Oral Daily Osvaldo Shipper, MD   75 mg at 06/26/20 0931  . DULoxetine (CYMBALTA) DR capsule 30 mg  30 mg Oral Daily Osvaldo Shipper, MD   30 mg at 06/26/20 0931  . escitalopram (LEXAPRO) tablet 20 mg  20 mg Oral Daily Osvaldo Shipper, MD   20 mg at 06/26/20 0932  . levothyroxine (SYNTHROID) tablet 50 mcg  50 mcg Oral Daily Osvaldo Shipper, MD   50 mcg at 06/26/20 0612  . metoprolol succinate  (TOPROL-XL) 24 hr tablet 100 mg  100 mg Oral Daily Osvaldo Shipper, MD   100 mg at 06/26/20 0932  . mirtazapine (REMERON) tablet 15 mg  15 mg Oral QHS Osvaldo Shipper, MD   15 mg at 06/25/20 2120  . ondansetron (ZOFRAN) injection 4 mg  4 mg Intravenous Q6H PRN Azucena Fallen, MD   4 mg at 06/26/20 0919  . ondansetron (ZOFRAN) tablet 4 mg  4 mg Oral Q6H PRN Azucena Fallen, MD      . pantoprazole (PROTONIX) injection 40 mg  40 mg Intravenous Q12H Osvaldo Shipper, MD   40 mg at 06/26/20 0931  . promethazine (PHENERGAN) suppository 12.5 mg  12.5 mg Rectal Q6H PRN Azucena Fallen, MD      . QUEtiapine (SEROQUEL) tablet 50 mg  50 mg Oral BID Osvaldo Shipper, MD   50 mg at 06/26/20 0931  . sucralfate (  CARAFATE) 1 GM/10ML suspension 1 g  1 g Oral Q6H Osvaldo ShipperKrishnan, Gokul, MD   1 g at 06/26/20 1313    Allergies as of 06/23/2020 - Review Complete 06/23/2020  Allergen Reaction Noted  . Dilaudid [hydromorphone hcl] Anaphylaxis and Other (See Comments) 10/09/2012  . Hydromorphone Anaphylaxis, Other (See Comments), and Rash 11/26/2011  . Morphine Swelling 11/26/2011  . Morphine and related Swelling, Anaphylaxis, Nausea And Vomiting, and Rash 11/26/2011  . Tetanus toxoids Other (See Comments) and Rash 11/26/2011  . Prochlorperazine Rash 11/26/2011  . Compazine [prochlorperazine edisylate] Other (See Comments) 10/09/2012    No family history on file.  Social History   Socioeconomic History  . Marital status: Married    Spouse name: Not on file  . Number of children: Not on file  . Years of education: Not on file  . Highest education level: Not on file  Occupational History  . Not on file  Tobacco Use  . Smoking status: Never Smoker  . Smokeless tobacco: Never Used  Substance and Sexual Activity  . Alcohol use: No  . Drug use: No  . Sexual activity: Yes    Birth control/protection: Surgical  Other Topics Concern  . Not on file  Social History Narrative  . Not on file   Social  Determinants of Health   Financial Resource Strain:   . Difficulty of Paying Living Expenses:   Food Insecurity:   . Worried About Programme researcher, broadcasting/film/videounning Out of Food in the Last Year:   . Baristaan Out of Food in the Last Year:   Transportation Needs:   . Freight forwarderLack of Transportation (Medical):   Marland Kitchen. Lack of Transportation (Non-Medical):   Physical Activity:   . Days of Exercise per Week:   . Minutes of Exercise per Session:   Stress:   . Feeling of Stress :   Social Connections:   . Frequency of Communication with Friends and Family:   . Frequency of Social Gatherings with Friends and Family:   . Attends Religious Services:   . Active Member of Clubs or Organizations:   . Attends BankerClub or Organization Meetings:   Marland Kitchen. Marital Status:   Intimate Partner Violence:   . Fear of Current or Ex-Partner:   . Emotionally Abused:   Marland Kitchen. Physically Abused:   . Sexually Abused:     Review of Systems: All systems reviewed and negative except where noted in HPI.  Physical Exam: Vital signs in last 24 hours: Temp:  [98.6 F (37 C)-99.1 F (37.3 C)] 98.6 F (37 C) (08/12 1403) Pulse Rate:  [72-73] 72 (08/12 1403) Resp:  [14-18] 14 (08/12 1403) BP: (120-128)/(74-76) 120/76 (08/12 1403) SpO2:  [99 %-100 %] 99 % (08/12 1403) Last BM Date: 06/22/20 General:   Alert, well-developed,  female in NAD Psych:  Pleasant, cooperative. Normal mood and affect. Eyes:  Pupils equal, sclera clear, no icterus.   Conjunctiva pink. Ears:  Normal auditory acuity. Nose:  No deformity, discharge,  or lesions. Neck:  Supple; no masses Lungs:  Clear throughout to auscultation.   No wheezes, crackles, or rhonchi.  Heart:  Regular rate and rhythm;  no lower extremity edema Abdomen:  Soft, non-distended, nontender, BS active, no palp mass   Rectal:  Deferred  Msk:  Symmetrical without gross deformities. . Neurologic:  Alert and  oriented x4;  grossly normal neurologically. Skin:  Intact without significant lesions or  rashes.   Intake/Output from previous day: 08/11 0701 - 08/12 0700 In: 834.1 [P.O.:270; I.V.:564.1] Out: -  Intake/Output this shift: No intake/output data recorded.  Lab Results: Recent Labs    06/23/20 2150 06/24/20 1118 06/25/20 0419  WBC 15.2* 10.4 10.4  HGB 13.2 12.3 12.4  HCT 38.5 38.9 38.9  PLT 387 127* 279   BMET Recent Labs    06/23/20 2150 06/24/20 1118 06/25/20 0419  NA 138 139 136  K 3.4* 3.6 3.4*  CL 106 108 105  CO2 18* 19* 18*  GLUCOSE 145* 131* 83  BUN 20 20 15   CREATININE 1.08* 0.86 0.85  CALCIUM 9.0 8.7* 8.3*   LFT Recent Labs    06/25/20 0419  PROT 6.6  ALBUMIN 3.8  AST 26  ALT 19  ALKPHOS 71  BILITOT 0.8   PT/INR No results for input(s): LABPROT, INR in the last 72 hours. Hepatitis Panel No results for input(s): HEPBSAG, HCVAB, HEPAIGM, HEPBIGM in the last 72 hours.   . CBC Latest Ref Rng & Units 06/25/2020 06/24/2020 06/23/2020  WBC 4.0 - 10.5 K/uL 10.4 10.4 15.2(H)  Hemoglobin 12.0 - 15.0 g/dL 08/23/2020 94.5 03.8  Hematocrit 36 - 46 % 38.9 38.9 38.5  Platelets 150 - 400 K/uL 279 127(L) 387    . CMP Latest Ref Rng & Units 06/25/2020 06/24/2020 06/23/2020  Glucose 70 - 99 mg/dL 83 08/23/2020) 800(L)  BUN 8 - 23 mg/dL 15 20 20   Creatinine 0.44 - 1.00 mg/dL 491(P 9.15)  Sodium 135 - 145 mmol/L 136 139 138  Potassium 3.5 - 5.1 mmol/L 3.4(L) 3.6 3.4(L)  Chloride 98 - 111 mmol/L 105 108 106  CO2 22 - 32 mmol/L 18(L) 19(L) 18(L)  Calcium 8.9 - 10.3 mg/dL 8.3(L) 8.7(L) 9.0  Total Protein 6.5 - 8.1 g/dL 6.6 7.0 -  Total Bilirubin 0.3 - 1.2 mg/dL 0.8 0.9 -  Alkaline Phos 38 - 126 U/L 71 79 -  AST 15 - 41 U/L 26 30 -  ALT 0 - 44 U/L 19 20 -   Studies/Results: No results found.  Principal Problem:   Acute gastritis Active Problems:   Intractable nausea and vomiting   Upper abdominal pain   Cardiac pacemaker in situ   CAD (coronary artery disease)   Hypothyroidism   Non-intractable vomiting with nausea    0.56, NP-C @   06/26/2020, 3:27 PM

## 2020-06-27 ENCOUNTER — Inpatient Hospital Stay (HOSPITAL_COMMUNITY): Payer: Medicare Other

## 2020-06-27 ENCOUNTER — Encounter (HOSPITAL_COMMUNITY)
Admission: EM | Disposition: A | Discharge status: Home / Self Care | Payer: Self-pay | Source: Home / Self Care | Attending: Family Medicine

## 2020-06-27 ENCOUNTER — Inpatient Hospital Stay (HOSPITAL_COMMUNITY): Payer: Medicare Other | Admitting: Anesthesiology

## 2020-06-27 ENCOUNTER — Encounter (HOSPITAL_COMMUNITY): Payer: Self-pay | Admitting: Internal Medicine

## 2020-06-27 DIAGNOSIS — K209 Esophagitis, unspecified without bleeding: Secondary | ICD-10-CM

## 2020-06-27 DIAGNOSIS — K3189 Other diseases of stomach and duodenum: Secondary | ICD-10-CM

## 2020-06-27 HISTORY — PX: ESOPHAGOGASTRODUODENOSCOPY (EGD) WITH PROPOFOL: SHX5813

## 2020-06-27 HISTORY — PX: BIOPSY: SHX5522

## 2020-06-27 LAB — CBC WITH DIFFERENTIAL/PLATELET
Abs Immature Granulocytes: 0.11 10*3/uL — ABNORMAL HIGH (ref 0.00–0.07)
Basophils Absolute: 0 10*3/uL (ref 0.0–0.1)
Basophils Relative: 1 %
Eosinophils Absolute: 0.3 10*3/uL (ref 0.0–0.5)
Eosinophils Relative: 3 %
HCT: 38.5 % (ref 36.0–46.0)
Hemoglobin: 12.7 g/dL (ref 12.0–15.0)
Immature Granulocytes: 1 %
Lymphocytes Relative: 21 %
Lymphs Abs: 1.7 10*3/uL (ref 0.7–4.0)
MCH: 29.4 pg (ref 26.0–34.0)
MCHC: 33 g/dL (ref 30.0–36.0)
MCV: 89.1 fL (ref 80.0–100.0)
Monocytes Absolute: 0.8 10*3/uL (ref 0.1–1.0)
Monocytes Relative: 10 %
Neutro Abs: 5.1 10*3/uL (ref 1.7–7.7)
Neutrophils Relative %: 64 %
Platelets: 275 10*3/uL (ref 150–400)
RBC: 4.32 MIL/uL (ref 3.87–5.11)
RDW: 12.2 % (ref 11.5–15.5)
WBC: 7.9 10*3/uL (ref 4.0–10.5)
nRBC: 0 % (ref 0.0–0.2)

## 2020-06-27 LAB — COMPREHENSIVE METABOLIC PANEL
ALT: 18 U/L (ref 0–44)
AST: 22 U/L (ref 15–41)
Albumin: 3.5 g/dL (ref 3.5–5.0)
Alkaline Phosphatase: 74 U/L (ref 38–126)
Anion gap: 13 (ref 5–15)
BUN: 9 mg/dL (ref 8–23)
CO2: 24 mmol/L (ref 22–32)
Calcium: 8.7 mg/dL — ABNORMAL LOW (ref 8.9–10.3)
Chloride: 104 mmol/L (ref 98–111)
Creatinine, Ser: 0.93 mg/dL (ref 0.44–1.00)
GFR calc Af Amer: >60 mL/min (ref >60)
GFR calc non Af Amer: >60 mL/min (ref >60)
Glucose, Bld: 87 mg/dL (ref 70–99)
Potassium: 3.9 mmol/L (ref 3.5–5.1)
Sodium: 141 mmol/L (ref 135–145)
Total Bilirubin: 1.1 mg/dL (ref 0.3–1.2)
Total Protein: 6.2 g/dL — ABNORMAL LOW (ref 6.5–8.1)

## 2020-06-27 LAB — MAGNESIUM: Magnesium: 2 mg/dL (ref 1.7–2.4)

## 2020-06-27 LAB — PHOSPHORUS: Phosphorus: 3.8 mg/dL (ref 2.5–4.6)

## 2020-06-27 SURGERY — ESOPHAGOGASTRODUODENOSCOPY (EGD) WITH PROPOFOL
Anesthesia: Monitor Anesthesia Care

## 2020-06-27 MED ORDER — FENTANYL CITRATE (PF) 100 MCG/2ML IJ SOLN
25.0000 ug | Freq: Once | INTRAMUSCULAR | Status: AC
  Administered 2020-06-27: 25 ug via INTRAVENOUS

## 2020-06-27 MED ORDER — ONDANSETRON HCL 4 MG/2ML IJ SOLN
INTRAMUSCULAR | Status: AC
  Filled 2020-06-27: qty 2

## 2020-06-27 MED ORDER — ONDANSETRON HCL 4 MG/2ML IJ SOLN
4.0000 mg | Freq: Once | INTRAMUSCULAR | Status: AC | PRN
  Administered 2020-06-27: 4 mg via INTRAVENOUS

## 2020-06-27 MED ORDER — PROPOFOL 500 MG/50ML IV EMUL
INTRAVENOUS | Status: DC | PRN
  Administered 2020-06-27: 30 mg via INTRAVENOUS

## 2020-06-27 MED ORDER — FENTANYL CITRATE (PF) 100 MCG/2ML IJ SOLN
INTRAMUSCULAR | Status: AC
  Filled 2020-06-27: qty 2

## 2020-06-27 MED ORDER — PROPOFOL 500 MG/50ML IV EMUL
INTRAVENOUS | Status: DC | PRN
  Administered 2020-06-27: 175 ug/kg/min via INTRAVENOUS

## 2020-06-27 SURGICAL SUPPLY — 15 items

## 2020-06-27 NOTE — Anesthesia Postprocedure Evaluation (Signed)
Anesthesia Post Note  Patient: Tiffany Villarreal  Procedure(s) Performed: ESOPHAGOGASTRODUODENOSCOPY (EGD) WITH PROPOFOL (N/A ) BIOPSY     Patient location during evaluation: PACU Anesthesia Type: MAC Level of consciousness: awake and alert and oriented Pain management: pain level controlled Vital Signs Assessment: post-procedure vital signs reviewed and stable Respiratory status: spontaneous breathing, nonlabored ventilation and respiratory function stable Cardiovascular status: stable and blood pressure returned to baseline Postop Assessment: no apparent nausea or vomiting Anesthetic complications: no   No complications documented.  Last Vitals:  Vitals:   06/27/20 1330 06/27/20 1340  BP: 132/73 (!) 141/68  Pulse: 82 78  Resp: 19 15  Temp: 37.2 C   SpO2: 100% 100%    Last Pain:  Vitals:   06/27/20 1340  TempSrc:   PainSc: 8                  Tiffany Villarreal A.

## 2020-06-27 NOTE — Anesthesia Preprocedure Evaluation (Addendum)
Anesthesia Evaluation  Patient identified by MRN, date of birth, ID band Patient awake    Reviewed: Allergy & Precautions, NPO status , Patient's Chart, lab work & pertinent test results, reviewed documented beta blocker date and time   History of Anesthesia Complications (+) PONV and history of anesthetic complications  Airway Mallampati: I  TM Distance: >3 FB Neck ROM: Full    Dental no notable dental hx. (+) Teeth Intact, Caps   Pulmonary sleep apnea and Continuous Positive Airway Pressure Ventilation , pneumonia, resolved,    Pulmonary exam normal breath sounds clear to auscultation       Cardiovascular + CAD  Normal cardiovascular exam+ dysrhythmias + pacemaker  Rhythm:Regular Rate:Normal  DES to LAD 7/21 Hx/o 2nd degree AV Block Metoprolol last dose 8/12   Neuro/Psych PSYCHIATRIC DISORDERS Anxiety Depression Cervical muscle spasms  Neuromuscular disease    GI/Hepatic Neg liver ROS, Upper abdominal pain N/V   Endo/Other  Hypothyroidism Hyperlipidemia  Renal/GU negative Renal ROS   Interstitial cystitis    Musculoskeletal  (+) Arthritis , Osteoarthritis,  NP Thoracic spine   Abdominal   Peds  Hematology  (+) anemia , Plavix therapy- last dose 06/26/20 Patient on DAPT due to stent placement in July   Anesthesia Other Findings   Reproductive/Obstetrics                           Anesthesia Physical Anesthesia Plan  ASA: III  Anesthesia Plan: MAC   Post-op Pain Management:    Induction: Intravenous  PONV Risk Score and Plan: 2 and Treatment may vary due to age or medical condition and Propofol infusion  Airway Management Planned: Natural Airway and Nasal Cannula  Additional Equipment:   Intra-op Plan:   Post-operative Plan:   Informed Consent: I have reviewed the patients History and Physical, chart, labs and discussed the procedure including the risks, benefits and  alternatives for the proposed anesthesia with the patient or authorized representative who has indicated his/her understanding and acceptance.     Dental advisory given  Plan Discussed with: Anesthesiologist and CRNA  Anesthesia Plan Comments:         Anesthesia Quick Evaluation

## 2020-06-27 NOTE — Plan of Care (Signed)

## 2020-06-27 NOTE — Transfer of Care (Signed)
Immediate Anesthesia Transfer of Care Note  Patient: Tiffany Villarreal  Procedure(s) Performed: ESOPHAGOGASTRODUODENOSCOPY (EGD) WITH PROPOFOL (N/A ) BIOPSY  Patient Location: PACU  Anesthesia Type:MAC  Level of Consciousness: awake, alert  and oriented  Airway & Oxygen Therapy: Patient Spontanous Breathing and Patient connected to face mask oxygen  Post-op Assessment: Report given to RN and Post -op Vital signs reviewed and stable  Post vital signs: Reviewed and stable  Last Vitals:  Vitals Value Taken Time  BP    Temp    Pulse 85 06/27/20 1323  Resp 13 06/27/20 1323  SpO2 100 % 06/27/20 1323  Vitals shown include unvalidated device data.  Last Pain:  Vitals:   06/27/20 1225  TempSrc: Oral  PainSc: 6       Patients Stated Pain Goal: 3 (28/41/32 4401)  Complications: No complications documented.

## 2020-06-27 NOTE — Interval H&P Note (Signed)
History and Physical Interval Note:  06/27/2020 12:37 PM  Tiffany Villarreal  has presented today for surgery, with the diagnosis of upper abdominal pain.  The various methods of treatment have been discussed with the patient and family. After consideration of risks, benefits and other options for treatment, the patient has consented to  Procedure(s): ESOPHAGOGASTRODUODENOSCOPY (EGD) WITH PROPOFOL (N/A) as a surgical intervention.  The patient's history has been reviewed, patient examined, no change in status, stable for surgery.  I have reviewed the patient's chart and labs.  Questions were answered to the patient's satisfaction.     [employer]

## 2020-06-27 NOTE — Care Management Important Message (Signed)
Important Message  Patient Details IM Letter given to the Patient Name: Tiffany Villarreal MRN: [id number] Date of Birth: 1951-05-17   Medicare Important Message Given:  Yes     Caren Macadam 06/27/2020, 10:37 AM

## 2020-06-27 NOTE — Progress Notes (Signed)
PROGRESS NOTE    Tiffany Villarreal  TKW:[id number] DOB: June 15, 1951 DOA: 06/23/2020 PCP: Bailey Mech, PA-C   Chief Complaint  Patient presents with  . Nausea    Brief Narrative:  Tiffany Collinsis Tiffany Villarreal 69 y.o.femalewith Tiffany Villarreal past medical history of coronary artery disease status postDESto LAD in July done at [facility], history of migraine headaches, history of second-degree heart block status post pacemaker, history of GI issues previously for which she has seen gastroenterology many years ago but not recently. She has had upper endoscopy and colonoscopy many years ago. She also has Tiffany Villarreal history of hypothyroidism. She presented to the emergency department on August 9 with complaints of nausea vomiting abdominal pain. She was given symptomatic treatment. She had Tiffany Villarreal CT scan done which was unremarkable for any acute process. She was discharged home. She came back within Tiffany Villarreal few hours with similar symptoms. Despite treatment in the ED she did not feel better so she was hospitalized. She complains of 10 out of 10 pain in the upper abdomen radiating to the chest and to the lower abdomen. Described as Tiffany Villarreal burning sensation at times. Associated with nausea and episodes of emesis. Denies any blood in the emesis. Last episode ofvomiting was yesterday. Has also been having loose stools. She has Tiffany Villarreal history of hemorrhoids and occasionally sees blood on the tissue paper. Has not noticed any more bleeding than that. Does admit to acid reflux but only takes Pepcid as needed. Denies any shortness of breath. She denies any fever or chills. Has been having some dizziness and lightheadedness. She apparently lost her balance and fell Tiffany Villarreal week ago. This is also not uncommon for her. Denies any weakness on any 1 side of her body. No episodes suggestive of seizure activity. She was noted to be mildly hypokalemic. Troponin level was normal. Due to lack of improvement she was  hospitalized.   Assessment & Plan:   Principal Problem:   Acute gastritis Active Problems:   Intractable nausea and vomiting   Upper abdominal pain   Cardiac pacemaker in situ   CAD (coronary artery disease)   Hypothyroidism   Non-intractable vomiting with nausea   Acute on chronic intractable nausea without vomiting Rule out gastritis Hx Billroth 1 Anastomosis for PUD -continued abdominal discomfort and nausea, tolerating only sips of ginger ale -no acute abnormality on CT scan -continue PPI and carafate  - EGD with grade C esophagitis distally, erythematous mucosa in gastric body, billroth II anastomosis, char by mild anastomosis ulceration.  Follow pending bx.   - PPI BID, consider repeat EGD at [facility] in 3 months - follow pathology -continue bentyl, zofran, phenergan, PPI BID, carafate  CAD, history of with obstruction  -LAD stent placed July 2021  -Continue on dual antiplatelet therapy with aspirin and Plavix, beta-blocker  -Recommend further discussion with cardiology and PCP outpatient for statin  History of AV block status post pacemaker: Stable.  History of hypothyroidism: Continue with levothyroxine.  Hypokalemia, mild:  Follow repeat labs. Check Mg.  Dehydration: improved  Leukemoid reaction, resolved  anxiety/depression Cymbalta, mirtazapine, Seroquel, Lexapro.  DVT prophylaxis: SCD Code Status: full  Family Communication: none at bedside Disposition:   Status is: Inpatient  Remains inpatient appropriate because:Inpatient level of care appropriate due to severity of illness   Dispo: The patient is from: Home              Anticipated d/c is to: Home  Anticipated d/c date is: 2 days              Patient currently is not medically stable to d/c.  Consultants:   Gastroenterology  Procedures:   none  Antimicrobials:  Anti-infectives (From admission, onward)   None     Subjective: Continued nausea and  pain  Objective: Vitals:   06/27/20 1420 06/27/20 1430 06/27/20 1440 06/27/20 1510  BP:  139/76 (!) 143/64 140/75  Pulse: 68 71 68 71  Resp: 15 13 15 17   Temp:    99 F (37.2 C)  TempSrc:    Oral  SpO2: 100% 100% 100% 100%  Weight:      Height:        Intake/Output Summary (Last 24 hours) at 06/27/2020 1837 Last data filed at 06/27/2020 0200 Gross per 24 hour  Intake 119.86 ml  Output --  Net 119.86 ml   Filed Weights   06/23/20 1735 06/24/20 1006  Weight: 68 kg 65.2 kg    Examination:  General: No acute distress. Cardiovascular: Heart sounds show Tiffany Villarreal regular rate, and rhythm Lungs: Clear to auscultation bilaterally . Abdomen: Soft, nontender, nondistended  Neurological: Alert and oriented 3. Moves all extremities 4Cranial nerves II through XII grossly intact. Skin: Warm and dry. No rashes or lesions. Extremities: No clubbing or cyanosis. No edema.     Data Reviewed: I have personally reviewed following labs and imaging studies  CBC: Recent Labs  Lab 06/23/20 1036 06/23/20 2150 06/24/20 1118 06/25/20 0419 06/27/20 0430  WBC 13.0* 15.2* 10.4 10.4 7.9  NEUTROABS  --  12.4*  --   --  5.1  HGB 14.4 13.2 12.3 12.4 12.7  HCT 42.8 38.5 38.9 38.9 38.5  MCV 85.6 85.2 93.3 93.1 89.1  PLT 457* 387 127* 279 275    Basic Metabolic Panel: Recent Labs  Lab 06/23/20 1036 06/23/20 2150 06/24/20 1118 06/25/20 0419 06/27/20 0430  NA 140 138 139 136 141  K 5.4* 3.4* 3.6 3.4* 3.9  CL 103 106 108 105 104  CO2 22 18* 19* 18* 24  GLUCOSE 142* 145* 131* 83 87  BUN 18 20 20 15 9   CREATININE 1.27* 1.08* 0.86 0.85 0.93  CALCIUM 9.6 9.0 8.7* 8.3* 8.7*  MG  --   --  2.1  --  2.0  PHOS  --   --   --   --  3.8    GFR: Estimated Creatinine Clearance: 54 mL/min (by C-G formula based on SCr of 0.93 mg/dL).  Liver Function Tests: Recent Labs  Lab 06/23/20 1036 06/24/20 1118 06/25/20 0419 06/27/20 0430  AST 31 30 26 22   ALT 22 20 19 18   ALKPHOS 87 79 71 74  BILITOT  1.1 0.9 0.8 1.1  PROT 7.9 7.0 6.6 6.2*  ALBUMIN 4.5 4.1 3.8 3.5    CBG: No results for input(s): GLUCAP in the last 168 hours.   Recent Results (from the past 240 hour(s))  SARS Coronavirus 2 by RT PCR (hospital order, performed in [facility] hospital lab) Nasopharyngeal Urine, Clean Catch     Status: None   Collection Time: 06/23/20 11:50 PM   Specimen: Urine, Clean Catch; Nasopharyngeal  Result Value Ref Range Status   SARS Coronavirus 2 NEGATIVE NEGATIVE Final    Comment: (NOTE) SARS-CoV-2 target nucleic acids are NOT DETECTED.  The SARS-CoV-2 RNA is generally detectable in upper and lower respiratory specimens during the acute phase of infection. The lowest concentration of SARS-CoV-2 viral copies this assay can  detect is 250 copies / mL. Xerxes Agrusa negative result does not preclude SARS-CoV-2 infection and should not be used as the sole basis for treatment or other patient management decisions.  Raekwon Winkowski negative result may occur with improper specimen collection / handling, submission of specimen other than nasopharyngeal swab, presence of viral mutation(s) within the areas targeted by this assay, and inadequate number of viral copies (<250 copies / mL). Jacquette Canales negative result must be combined with clinical observations, patient history, and epidemiological information.  Fact Sheet for Patients:   BoilerBrush.com.cy  Fact Sheet for Healthcare Providers: https://pope.com/  This test is not yet approved or  cleared by the [place] FDA and has been authorized for detection and/or diagnosis of SARS-CoV-2 by FDA under an Emergency Use Authorization (EUA).  This EUA will remain in effect (meaning this test can be used) for the duration of the COVID-19 declaration under Section 564(b)(1) of the Act, 21 U.S.C. section 360bbb-3(b)(1), unless the authorization is terminated or revoked sooner.  Performed at [facility], [address] [place], [place] [address]          Radiology Studies: DG CHEST PORT 1 VIEW  Result Date: 06/27/2020 CLINICAL DATA:  Chest pain EXAM: PORTABLE CHEST 1 VIEW COMPARISON:  October 01, 2019 FINDINGS: There is mild scarring in the right base. Lungs elsewhere are clear. Heart size and pulmonary vascularity are normal. Pacemaker leads are attached to the right atrium and right ventricle. No evident pneumothorax. No bone lesions. IMPRESSION: Mild scarring right base. Lungs elsewhere clear. Stable cardiac silhouette. Pacemaker leads attached to right atrium and right ventricle. Electronically Signed   By: Bretta Bang III M.D.   On: 06/27/2020 14:44   DG Abd Portable 1V  Result Date: 06/27/2020 CLINICAL DATA:  Nausea EXAM: PORTABLE ABDOMEN - 1 VIEW COMPARISON:  September 21, 2016 FINDINGS: Postoperative changes are noted in the upper abdomen. There is Tayah Idrovo linear radiopaque structure which presumably overlies the left upper quadrant. Surgical clips are also noted overlying the mid left sacrum. There is no appreciable bowel dilatation or air-fluid level to suggest bowel obstruction. No free air IMPRESSION: No bowel obstruction or free air appreciable. Postoperative changes noted. Electronically Signed   By: Bretta Bang III M.D.   On: 06/27/2020 14:43        Scheduled Meds: . aspirin EC  81 mg Oral Daily  . clopidogrel  75 mg Oral Daily  . dicyclomine  20 mg Oral TID AC & HS  . DULoxetine  30 mg Oral Daily  . escitalopram  20 mg Oral Daily  . levothyroxine  50 mcg Oral Daily  . metoprolol succinate  100 mg Oral Daily  . mirtazapine  15 mg Oral QHS  . pantoprazole (PROTONIX) IV  40 mg Intravenous Q12H  . QUEtiapine  50 mg Oral BID  . sucralfate  1 g Oral Q6H   Continuous Infusions: . lactated ringers with kcl 75 mL/hr at 06/27/20 1755     LOS: 2 days    Time spent: over 30 min    Lacretia Nicks, MD [facility]   To contact the attending provider between  7A-7P or the covering provider during after hours 7P-7A, please log into the web site [email or web address] and access using universal [place] password for that web site. If you do not have the password, please call the hospital operator.  06/27/2020, 6:37 PM

## 2020-06-27 NOTE — Op Note (Signed)
[facility] Patient Name: Tiffany Villarreal Procedure Date: 06/27/2020 MRN: [id number] Attending MD:  Mansouraty , MD Date of Birth: 04/23/1951 CSN: [id number] Age: 68 Admit Type: Inpatient Procedure:                Upper GI endoscopy Indications:              History of PUD s/p Bilroth 2, Generalized abdominal                            pain, Nausea with vomiting - previous extensive                            workup at [facility] was felt to be functional Providers:                 Mansouraty, MD, Hayleigh Westmoreland, RN,                            Darlene Davis, Technician, Kelley Carver, CRNA Referring MD:             [facility] Medicines:                Monitored Anesthesia Care Complications:            No immediate complications. Estimated Blood Loss:     Estimated blood loss was minimal. Procedure:                Pre-Anesthesia Assessment:                           - Prior to the procedure, a History and Physical                            was performed, and patient medications and                            allergies were reviewed. The patient's tolerance of                            previous anesthesia was also reviewed. The risks                            and benefits of the procedure and the sedation                            options and risks were discussed with the patient.                            All questions were answered, and informed consent                            was obtained. Prior Anticoagulants: The patient has                            taken no previous anticoagulant or antiplatelet                              agents. ASA Grade Assessment: III - A patient with                            severe systemic disease. After reviewing the risks                            and benefits, the patient was deemed in                            satisfactory condition to undergo the procedure.                           After obtaining  informed consent, the endoscope was                            passed under direct vision. Throughout the                            procedure, the patient's blood pressure, pulse, and                            oxygen saturations were monitored continuously. The                            GIF-H190 ([id number]) Olympus gastroscope was                            introduced through the mouth, and advanced to the                            jejunum. The upper GI endoscopy was accomplished                            without difficulty. The patient tolerated the                            procedure. Scope In: Scope Out: Findings:      No gross lesions were noted in the proximal esophagus and in the mid       esophagus.      LA Grade C (one or more mucosal breaks continuous between tops of 2 or       more mucosal folds, less than 75% circumference) esophagitis with no       bleeding was found 30 to 35 cm from the incisors. Biopsies were taken       with a cold forceps for histology.      Patchy mildly erythematous mucosa without bleeding was found in the       gastric body. Biopsies were taken with a cold forceps for histology and       Helicobacter pylori testing.      Evidence of a Billroth II anastomosis was found in the gastric antrum.       This was characterized by very minimal ulceration surrounding the       anastomosis.      Normal mucosa was found in the afferent/efferent jejunal limbs. Biopsies  were taken with a cold forceps for histology. Impression:               - No gross lesions in esophagus proximally. LA                            Grade C esophagitis with no bleeding - distally;                            biopsied.                           - Erythematous mucosa in the gastric body. Biopsied.                           - A Billroth II anastomosis was found,                            characterized by mild anastomosis ulceration.                           - Normal mucosa  was found in the visualized                            afferent/efferent jejunal limbs. Biopsied. Moderate Sedation:      Not Applicable - Patient had care per Anesthesia. Recommendation:           - The patient will be observed post-procedure,                            until all discharge criteria are met.                           - Return patient to hospital ward for ongoing care.                           - Advance diet as tolerated.                           - Await pathology results.                           - Follow up with primary GI at [facility].                           - Start PPI BID.                           - Consider repeat EGD by primary GI at [facility] in                            3-months.                           - Continue Bentyl QID.                           -   Observe patient's clinical course.                           - The findings and recommendations were discussed                            with the patient. Procedure Code(s):        --- Professional ---                           43239, Esophagogastroduodenoscopy, flexible,                            transoral; with biopsy, single or multiple Diagnosis Code(s):        --- Professional ---                           K20.90, Esophagitis, unspecified without bleeding                           K31.89, Other diseases of stomach and duodenum                           Z98.0, Intestinal bypass and anastomosis status                           R10.84, Generalized abdominal pain                           R11.2, Nausea with vomiting, unspecified CPT copyright 2019 American Medical Association. All rights reserved. The codes documented in this report are preliminary and upon coder review may  be revised to meet current compliance requirements.  Mansouraty, MD 06/27/2020 6:14:02 PM Number of Addenda: 0 

## 2020-06-28 ENCOUNTER — Encounter (HOSPITAL_COMMUNITY): Payer: Self-pay | Admitting: Gastroenterology

## 2020-06-28 DIAGNOSIS — K289 Gastrojejunal ulcer, unspecified as acute or chronic, without hemorrhage or perforation: Secondary | ICD-10-CM

## 2020-06-28 DIAGNOSIS — R1084 Generalized abdominal pain: Secondary | ICD-10-CM

## 2020-06-28 DIAGNOSIS — K209 Esophagitis, unspecified without bleeding: Secondary | ICD-10-CM

## 2020-06-28 LAB — CBC WITH DIFFERENTIAL/PLATELET
Abs Immature Granulocytes: 0.07 10*3/uL (ref 0.00–0.07)
Basophils Absolute: 0 10*3/uL (ref 0.0–0.1)
Basophils Relative: 1 %
Eosinophils Absolute: 0.2 10*3/uL (ref 0.0–0.5)
Eosinophils Relative: 4 %
HCT: 35.6 % — ABNORMAL LOW (ref 36.0–46.0)
Hemoglobin: 11.8 g/dL — ABNORMAL LOW (ref 12.0–15.0)
Immature Granulocytes: 1 %
Lymphocytes Relative: 26 %
Lymphs Abs: 1.4 10*3/uL (ref 0.7–4.0)
MCH: 29.4 pg (ref 26.0–34.0)
MCHC: 33.1 g/dL (ref 30.0–36.0)
MCV: 88.6 fL (ref 80.0–100.0)
Monocytes Absolute: 0.5 10*3/uL (ref 0.1–1.0)
Monocytes Relative: 10 %
Neutro Abs: 3.3 10*3/uL (ref 1.7–7.7)
Neutrophils Relative %: 58 %
Platelets: 255 10*3/uL (ref 150–400)
RBC: 4.02 MIL/uL (ref 3.87–5.11)
RDW: 12.3 % (ref 11.5–15.5)
WBC: 5.6 10*3/uL (ref 4.0–10.5)
nRBC: 0 % (ref 0.0–0.2)

## 2020-06-28 LAB — COMPREHENSIVE METABOLIC PANEL
ALT: 15 U/L (ref 0–44)
AST: 18 U/L (ref 15–41)
Albumin: 3.4 g/dL — ABNORMAL LOW (ref 3.5–5.0)
Alkaline Phosphatase: 68 U/L (ref 38–126)
Anion gap: 13 (ref 5–15)
BUN: 9 mg/dL (ref 8–23)
CO2: 24 mmol/L (ref 22–32)
Calcium: 8.4 mg/dL — ABNORMAL LOW (ref 8.9–10.3)
Chloride: 105 mmol/L (ref 98–111)
Creatinine, Ser: 0.76 mg/dL (ref 0.44–1.00)
GFR calc Af Amer: >60 mL/min (ref >60)
GFR calc non Af Amer: >60 mL/min (ref >60)
Glucose, Bld: 86 mg/dL (ref 70–99)
Potassium: 3.8 mmol/L (ref 3.5–5.1)
Sodium: 142 mmol/L (ref 135–145)
Total Bilirubin: 0.9 mg/dL (ref 0.3–1.2)
Total Protein: 6 g/dL — ABNORMAL LOW (ref 6.5–8.1)

## 2020-06-28 LAB — CORTISOL-AM, BLOOD: Cortisol - AM: 12 ug/dL (ref 6.7–22.6)

## 2020-06-28 LAB — MAGNESIUM: Magnesium: 1.9 mg/dL (ref 1.7–2.4)

## 2020-06-28 LAB — PHOSPHORUS: Phosphorus: 3.7 mg/dL (ref 2.5–4.6)

## 2020-06-28 MED ORDER — ESCITALOPRAM OXALATE 20 MG PO TABS: 20.0000 mg | ORAL_TABLET | Freq: Every day | ORAL | 0 refills | Status: AC

## 2020-06-28 MED ORDER — DULOXETINE HCL 30 MG PO CPEP: 30.0000 mg | ORAL_CAPSULE | Freq: Every day | ORAL | 0 refills | Status: AC

## 2020-06-28 MED ORDER — DICYCLOMINE HCL 20 MG PO TABS: 20.0000 mg | ORAL_TABLET | Freq: Three times a day (TID) | ORAL | 0 refills | Status: AC

## 2020-06-28 MED ORDER — MIRTAZAPINE 15 MG PO TABS: 15.0000 mg | ORAL_TABLET | Freq: Every day | ORAL | 0 refills | Status: DC

## 2020-06-28 MED ORDER — PANTOPRAZOLE SODIUM 40 MG PO TBEC: 40.0000 mg | DELAYED_RELEASE_TABLET | Freq: Two times a day (BID) | ORAL | 1 refills | Status: AC

## 2020-06-28 NOTE — Plan of Care (Signed)
  Problem: Education: Goal: Knowledge of General Education information will improve Description Including pain rating scale, medication(s)/side effects and non-pharmacologic comfort measures Outcome: Progressing   

## 2020-06-28 NOTE — Progress Notes (Signed)
Progress Note        ASSESSMENT AND PLAN:   CC:    Abdominal pain, nausea and vomiting  Tiffany Villarreal is a 69 y.o. female PMH significant for, but not necessarily limited to, hypertension, CAD, AV block status post pacemaker,  sleep apnea, OA, interstitial cystitis, hypothyroidism,   hyperlipidemia, migraines, depression, schizoaffective disorder,  chronic back pain,    cholecystectomy, appendectomy, hysterectomy, PUD/Billroth I, iron deficiency anemia, B12 deficiency, chronic gastritis.                                                                                                                         # Mid upper abdominal pain / nausea / vomiting in patient with hx of Bilroth I for PUD --Similar pain in 2017-2018 with extensive negative workup with [facility]. Pain felt to be functional at that time and resolved with Bentyl and anti-emetics --Symptoms had resolved, now recurrent over last two weeks.  --CT scan w/ contrast , labs this admission unremarkable.  --EGD yesterday showed Grade C esophagitis, gastritis, some small ulcerations at anastomosis. Biopsies pending.  --Some nausea today. No vomiting. Abdominal pain is better, she thinks meds are helping.  --Bentyl helped in past, we resumed it this admission --Continue Bentyl ac and HS  --Continue BID PPI --Patient can be discharged from GI standpoint.  --She may decide not to go back to [facility]. Has interest in following with [facility]. She will make a follow up appt at one or the other .     EGD 8/13   Findings: LA Grade C (one or more mucosal breaks continuous between tops of 2 or more mucosal folds, less than 75% circumference) esophagitis with no bleeding was found 30 to 35 cm from the incisors. Biopsies were taken with a cold forceps for histology. Patchy mildly erythematous mucosa without bleeding was found in the gastric body. Biopsies were taken with a cold forceps for histology and Helicobacter pylori  testing. Evidence of a Billroth II anastomosis was found in the gastric antrum. This was characterized by very minimal ulceration surrounding the anastomosis. Normal mucosa was found in the afferent/efferent jejunal limbs. Biopsies were taken with a cold forceps for histology.    SUBJECTIVE   Some nausea. Abdominal pain is better.     OBJECTIVE:     Vital signs in last 24 hours: Temp:  [98.6 F (37 C)-99 F (37.2 C)] 98.9 F (37.2 C) (08/14 0636) Pulse Rate:  [66-82] 66 (08/14 0636) Resp:  [13-19] 15 (08/14 0636) BP: (100-148)/(56-81) 100/56 (08/14 0636) SpO2:  [99 %-100 %] 100 % (08/14 0636) Last BM Date: 06/22/20 General:   Alert, in NAD Heart:  Regular rate and rhythm.  No lower extremity edema   Pulm: Normal respiratory effort   Abdomen:  Soft,  nontender, nondistended.  Normal bowel sounds.          Neurologic:  Alert and  oriented,  grossly normal neurologically. Psych:  Pleasant, cooperative.  Normal mood and affect.   Intake/Output from previous day: No intake/output data recorded. Intake/Output this shift: No intake/output data recorded.  Lab Results: Recent Labs    06/27/20 0430 06/28/20 0425  WBC 7.9 5.6  HGB 12.7 11.8*  HCT 38.5 35.6*  PLT 275 255   BMET Recent Labs    06/27/20 0430 06/28/20 0425  NA 141 142  K 3.9 3.8  CL 104 105  CO2 24 24  GLUCOSE 87 86  BUN 9 9  CREATININE 0.93 0.76  CALCIUM 8.7* 8.4*   LFT Recent Labs    06/28/20 0425  PROT 6.0*  ALBUMIN 3.4*  AST 18  ALT 15  ALKPHOS 68  BILITOT 0.9   PT/INR No results for input(s): LABPROT, INR in the last 72 hours. Hepatitis Panel No results for input(s): HEPBSAG, HCVAB, HEPAIGM, HEPBIGM in the last 72 hours.  DG CHEST PORT 1 VIEW  Result Date: 06/27/2020 CLINICAL DATA:  Chest pain EXAM: PORTABLE CHEST 1 VIEW COMPARISON:  October 01, 2019 FINDINGS: There is mild scarring in the right base. Lungs elsewhere are clear. Heart size and pulmonary vascularity are normal.  Pacemaker leads are attached to the right atrium and right ventricle. No evident pneumothorax. No bone lesions. IMPRESSION: Mild scarring right base. Lungs elsewhere clear. Stable cardiac silhouette. Pacemaker leads attached to right atrium and right ventricle. Electronically Signed   By: Bretta Bang III M.D.   On: 06/27/2020 14:44   DG Abd Portable 1V  Result Date: 06/27/2020 CLINICAL DATA:  Nausea EXAM: PORTABLE ABDOMEN - 1 VIEW COMPARISON:  September 21, 2016 FINDINGS: Postoperative changes are noted in the upper abdomen. There is a linear radiopaque structure which presumably overlies the left upper quadrant. Surgical clips are also noted overlying the mid left sacrum. There is no appreciable bowel dilatation or air-fluid level to suggest bowel obstruction. No free air IMPRESSION: No bowel obstruction or free air appreciable. Postoperative changes noted. Electronically Signed   By: Bretta Bang III M.D.   On: 06/27/2020 14:43     Principal Problem:   Acute gastritis Active Problems:   Intractable nausea and vomiting   Upper abdominal pain   Cardiac pacemaker in situ   CAD (coronary artery disease)   Hypothyroidism   Non-intractable vomiting with nausea   Acute esophagitis   Ulcer, anastomotic   Generalized abdominal pain     LOS: 3 days   Willette Cluster ,NP 06/28/2020, 12:36 PM

## 2020-06-28 NOTE — Progress Notes (Signed)
Pt tolerating clears, review notes of GI MD,  diet as tol. Pt advance to soft diet.

## 2020-06-28 NOTE — Discharge Summary (Signed)
Physician Discharge Summary  Tiffany Villarreal BOF:[id number] DOB: [birth year] DOA: 06/23/2020  PCP: Tiffany Mech, PA-C  Admit date: 06/23/2020 Discharge date: 06/28/2020  Time spent: 40 minutes  Recommendations for Outpatient Follow-up:  1. Follow outpatient CBC/CMP 2. Follow pathology results 3. Continue BID PPI 4. Follow with outpatient GI for repeat EGD in about 3 months 5. Consider starting statin for CAD  Discharge Diagnoses:  Principal Problem:   Acute gastritis Active Problems:   Intractable nausea and vomiting   Upper abdominal pain   Cardiac pacemaker in situ   CAD (coronary artery disease)   Hypothyroidism   Non-intractable vomiting with nausea   Acute esophagitis   Ulcer, anastomotic   Generalized abdominal pain   Discharge Condition: stable  Diet recommendation: heart healthy  Filed Weights   06/23/20 1735 06/24/20 1006  Weight: 68 kg 65.2 kg   History of present illness:  Tiffany Collinsis Tiffany Villarreal 69 y.o.femalewith [name] past medical history of coronary artery disease status postDESto LAD in July done at [facility], history of migraine headaches, history of second-degree heart block status post pacemaker, history of GI issues previously for which she has seen gastroenterology many years ago but not recently. She has had upper endoscopy and colonoscopy many years ago. She also has [name] history of hypothyroidism. She presented to the emergency department on August 9 with complaints of nausea vomiting abdominal pain. She was given symptomatic treatment. She had [name] CT scan done which was unremarkable for any acute process. She was discharged home. She came back within [name] few hours with similar symptoms. Despite treatment in the ED she did not feel better so she was hospitalized. She complains of 10 out of 10 pain in the upper abdomen radiating to the chest and to the lower abdomen. Described as [name] burning sensation at times. Associated with nausea and  episodes of emesis. Denies any blood in the emesis. Last episode ofvomiting was yesterday. Has also been having loose stools. She has [name] history of hemorrhoids and occasionally sees blood on the tissue paper. Has not noticed any more bleeding than that. Does admit to acid reflux but only takes Pepcid as needed. Denies any shortness of breath. She denies any fever or chills. Has been having some dizziness and lightheadedness. She apparently lost her balance and fell [name] week ago. This is also not uncommon for her. Denies any weakness on any 1 side of her body. No episodes suggestive of seizure activity. She was noted to be mildly hypokalemic. Troponin level was normal. Due to lack of improvement she was hospitalized.  She was admitted for nausea and abdominal pain.  She was seen by GI who performed and EGD which showed esophagitis, gastritis, and an anastomotic ulceration.  Started on BID PPI and bentyl.  Plan for outpatient follow up with GI.  Follow pending path.  She's improved on d/c with less nausea, tolerating PO.  See below for additional details  Hospital Course:  Acute on chronic intractable nausea without vomiting Rule out gastritis Hx Billroth 1 Anastomosis for PUD -continued abdominal discomfort and nausea, tolerating only sips of ginger ale -no acute abnormality on CT scan -continue PPI and carafate  - EGD with grade C esophagitis distally, erythematous mucosa in gastric body, billroth II anastomosis, char by mild anastomosis ulceration.  Follow pending bx.   - PPI BID, consider repeat EGD at [facility] in 3 months - follow pathology -continue bentyl, zofran, phenergan, PPI BID, carafate  CAD, history of with obstruction -LAD stent  placed July 2021 -Continue on dual antiplatelet therapy with aspirin and Plavix, beta-blocker -Recommend further discussion with cardiology and PCP outpatient for statin  History of AV block status post pacemaker: Stable.  History of  hypothyroidism: Continue with levothyroxine.  Hypokalemia, mild: Followrepeat labs. Check Mg.  Dehydration: improved  Leukemoid reaction, resolved  anxiety/depression Cymbalta, mirtazapine, Seroquel, Lexapro.  Procedures: EGD  Consultations:  GI  Discharge Exam: Vitals:   06/28/20 0636 06/28/20 1315  BP: (!) 100/56 126/72  Pulse: 66 75  Resp: 15 18  Temp: 98.9 F (37.2 C) 98.1 F (36.7 C)  SpO2: 100% 98%   Nausea resolved Pain improved  General: No acute distress. Cardiovascular: Heart sounds show [name] regular rate, and rhythm.  Lungs: Clear to auscultation bilaterally Abdomen: Soft, nontender, nondistended  Neurological: Alert and oriented 3. Moves all extremities 4 . Cranial nerves II through XII grossly intact. Skin: Warm and dry. No rashes or lesions. Extremities: No clubbing or cyanosis. No edema  Discharge Instructions   Discharge Instructions    Call MD for:  difficulty breathing, headache or visual disturbances   Complete by: As directed    Call MD for:  extreme fatigue   Complete by: As directed    Call MD for:  hives   Complete by: As directed    Call MD for:  persistant dizziness or light-headedness   Complete by: As directed    Call MD for:  persistant nausea and vomiting   Complete by: As directed    Call MD for:  redness, tenderness, or signs of infection (pain, swelling, redness, odor or green/yellow discharge around incision site)   Complete by: As directed    Call MD for:  severe uncontrolled pain   Complete by: As directed    Call MD for:  temperature >100.4   Complete by: As directed    Diet - low sodium heart healthy   Complete by: As directed    Discharge instructions   Complete by: As directed    You were seen for abdominal discomfort.    You've been started on bentyl and protonix twice daily.  You had and EGD which showed grade C esophagitis, gastritis, and ulcerations at the anastomosis.  You have biopsies which are  pending.  Please follow up with gastroenterology as an outpatient.  You can follow up with [facility] or [facility], whichevery you prefer.  You may need France Noyce follow up EGD in Raylea Adcox few months.  Expect Jessieca Rhem call from [facility] with biopsy results.  Return for new, recurrent, or worsening symptoms.  Please ask your PCP to request records from this hospitalization so they know what was done and what the next steps will be.   Increase activity slowly   Complete by: As directed      Allergies as of 06/28/2020      Reactions   Dilaudid [hydromorphone Hcl] Anaphylaxis, Other (See Comments)   Mental, psychotic   Hydromorphone Anaphylaxis, Other (See Comments), Rash   Delusions - Intolerance Mental, psychotic   Morphine Swelling   Morphine And Related Swelling, Anaphylaxis, Nausea And Vomiting, Rash   Of lips and tongue Of lips and tongue Of lips and tongue Mental, psychotic Of lips and tongue From [facility] records via Care Everywhere   Tetanus Toxoids Other (See Comments), Rash   fever fever   Gluten Meal    Other reaction(s): Other (See Comments) Intolerance intolerence   Nsaids Other (See Comments)   Upset stomach, hx of GI bleed  Prochlorperazine Rash   Stiff neck Stiffness Stiff neck Stiff neck stiffness   Compazine [prochlorperazine Edisylate] Other (See Comments)   Stiff neck      Medication List    TAKE these medications   acetaminophen 325 MG tablet Commonly known as: TYLENOL Take 650 mg by mouth every 6 (six) hours as needed for mild pain or headache.   Aspirin Low Dose 81 MG EC tablet Generic drug: aspirin Take 81 mg by mouth daily.   clopidogrel 75 MG tablet Commonly known as: PLAVIX Take 75 mg by mouth daily.   dicyclomine 20 MG tablet Commonly known as: BENTYL Take 1 tablet (20 mg total) by mouth 4 (four) times daily -  before meals and at bedtime.   DULoxetine 30 MG capsule Commonly known as: CYMBALTA Take 1 capsule (30 mg total) by mouth daily.    escitalopram 20 MG tablet Commonly known as: LEXAPRO Take 1 tablet (20 mg total) by mouth daily.   hydrOXYzine 25 MG tablet Commonly known as: ATARAX/VISTARIL Take 12.5-25 mg by mouth 3 (three) times daily as needed for anxiety.   levothyroxine 50 MCG tablet Commonly known as: SYNTHROID Take 50 mcg by mouth daily.   metoprolol succinate 100 MG 24 hr tablet Commonly known as: TOPROL-XL Take 100 mg by mouth 2 (two) times daily.   mirtazapine 15 MG tablet Commonly known as: REMERON Take 1 tablet (15 mg total) by mouth at bedtime.   pantoprazole 40 MG tablet Commonly known as: Protonix Take 1 tablet (40 mg total) by mouth 2 (two) times daily.   potassium chloride 10 MEQ tablet Commonly known as: KLOR-CON Take 10 mEq by mouth daily.   promethazine 25 MG tablet Commonly known as: PHENERGAN Take 1 tablet (25 mg total) by mouth every 6 (six) hours as needed for nausea or vomiting.   QUEtiapine 50 MG tablet Commonly known as: SEROQUEL Take 100 mg by mouth daily.      Allergies  Allergen Reactions   Dilaudid [Hydromorphone Hcl] Anaphylaxis and Other (See Comments)    Mental, psychotic   Hydromorphone Anaphylaxis, Other (See Comments) and Rash    Delusions - Intolerance Mental, psychotic    Morphine Swelling   Morphine And Related Swelling, Anaphylaxis, Nausea And Vomiting and Rash    Of lips and tongue Of lips and tongue Of lips and tongue Mental, psychotic Of lips and tongue From [facility] records via Care Everywhere    Tetanus Toxoids Other (See Comments) and Rash    fever fever    Gluten Meal     Other reaction(s): Other (See Comments) Intolerance intolerence    Nsaids Other (See Comments)    Upset stomach, hx of GI bleed   Prochlorperazine Rash    Stiff neck Stiffness Stiff neck Stiff neck stiffness    Compazine [Prochlorperazine Edisylate] Other (See Comments)    Stiff neck      The results of significant diagnostics from this  hospitalization (including imaging, microbiology, ancillary and laboratory) are listed below for reference.    Significant Diagnostic Studies: CT Abdomen Pelvis W Contrast  Result Date: 06/23/2020 CLINICAL DATA:  Abdominal pain EXAM: CT ABDOMEN AND PELVIS WITH CONTRAST TECHNIQUE: Multidetector CT imaging of the abdomen and pelvis was performed using the standard protocol following bolus administration of intravenous contrast. CONTRAST:  OMNIPAQUE IOHEXOL 300 MG/ML  SOLN COMPARISON:  2017 FINDINGS: Lower chest: No acute abnormality. Hepatobiliary: Enhancing lesions previously characterized as hemangiomas. Cholecystectomy. No unexpected biliary dilatation. Pancreas: Unremarkable. Spleen: Slight increase in size  of small low-attenuation splenic lesions, which are still likely benign. Adrenals/Urinary Tract: Adrenals are unremarkable. Small bilateral renal cysts and additional too small to characterize hypoattenuating lesions. Bladder is unremarkable. Stomach/Bowel: Bowel is normal in caliber with moderate stool burden. Vascular/Lymphatic: Aortic atherosclerosis. There are no enlarged lymph nodes identified. Reproductive: Status post hysterectomy. No adnexal masses. Other: No ascites.  No acute abnormality of the abdominal wall. Musculoskeletal: Stable vertebral body heights. No acute osseous abnormality. IMPRESSION: No acute abnormality or findings to account for reported symptoms. Electronically Signed   By: Guadlupe SpanishPraneil  Patel M.D.   On: 06/23/2020 12:12   DG CHEST PORT 1 VIEW  Result Date: 06/27/2020 CLINICAL DATA:  Chest pain EXAM: PORTABLE CHEST 1 VIEW COMPARISON:  October 01, 2019 FINDINGS: There is mild scarring in the right base. Lungs elsewhere are clear. Heart size and pulmonary vascularity are normal. Pacemaker leads are attached to the right atrium and right ventricle. No evident pneumothorax. No bone lesions. IMPRESSION: Mild scarring right base. Lungs elsewhere clear. Stable cardiac silhouette.  Pacemaker leads attached to right atrium and right ventricle. Electronically Signed   By: Bretta BangWilliam  Woodruff III M.D.   On: 06/27/2020 14:44   DG Abd Portable 1V  Result Date: 06/27/2020 CLINICAL DATA:  Nausea EXAM: PORTABLE ABDOMEN - 1 VIEW COMPARISON:  September 21, 2016 FINDINGS: Postoperative changes are noted in the upper abdomen. There is Myrian Botello linear radiopaque structure which presumably overlies the left upper quadrant. Surgical clips are also noted overlying the mid left sacrum. There is no appreciable bowel dilatation or air-fluid level to suggest bowel obstruction. No free air IMPRESSION: No bowel obstruction or free air appreciable. Postoperative changes noted. Electronically Signed   By: Bretta BangWilliam  Woodruff III M.D.   On: 06/27/2020 14:43    Microbiology: Recent Results (from the past 240 hour(s))  SARS Coronavirus 2 by RT PCR (hospital order, [facility]) Nasopharyngeal Urine, Clean Catch     Status: None   Collection Time: 06/23/20 11:50 PM   Specimen: Urine, Clean Catch; Nasopharyngeal  Result Value Ref Range Status   SARS Coronavirus 2 NEGATIVE NEGATIVE Final    Comment: (NOTE) SARS-CoV-2 target nucleic acids are NOT DETECTED.  The SARS-CoV-2 RNA is generally detectable in upper and lower respiratory specimens during the acute phase of infection. The lowest concentration of SARS-CoV-2 viral copies this assay can detect is 250 copies / mL. Yanil Dawe negative result does not preclude SARS-CoV-2 infection and should not be used as the sole basis for treatment or other patient management decisions.  Navea Woodrow negative result may occur with improper specimen collection / handling, submission of specimen other than nasopharyngeal swab, presence of viral mutation(s) within the areas targeted by this assay, and inadequate number of viral copies (<250 copies / mL). Kaegan Stigler negative result must be combined with clinical observations, patient history, and epidemiological  information.  Fact Sheet for Patients:   BoilerBrush.com.cyhttps://www.fda.gov/media/136312/download  Fact Sheet for Healthcare Providers: https://pope.com/https://www.fda.gov/media/136313/download  This test is not yet approved or  cleared by the [place] FDA and has been authorized for detection and/or diagnosis of SARS-CoV-2 by FDA under an Emergency Use Authorization (EUA).  This EUA will remain in effect (meaning this test can be used) for the duration of the COVID-19 declaration under Section 564(b)(1) of the Act, 21 U.S.C. section 360bbb-3(b)(1), unless the authorization is terminated or revoked sooner.  Performed at [facility] [place], [address] [place], [place] [address]      Labs: Basic Metabolic Panel:  Recent Labs  Lab 06/23/20 2150 06/24/20 1118 06/25/20 0419 06/27/20 0430 06/28/20 0425  NA 138 139 136 141 142  K 3.4* 3.6 3.4* 3.9 3.8  CL 106 108 105 104 105  CO2 18* 19* 18* 24 24  GLUCOSE 145* 131* 83 87 86  BUN 20 20 15 9 9   CREATININE 1.08* 0.86 0.85 0.93 0.76  CALCIUM 9.0 8.7* 8.3* 8.7* 8.4*  MG  --  2.1  --  2.0 1.9  PHOS  --   --   --  3.8 3.7   Liver Function Tests: Recent Labs  Lab 06/23/20 1036 06/24/20 1118 06/25/20 0419 06/27/20 0430 06/28/20 0425  AST 31 30 26 22 18   ALT 22 20 19 18 15   ALKPHOS 87 79 71 74 68  BILITOT 1.1 0.9 0.8 1.1 0.9  PROT 7.9 7.0 6.6 6.2* 6.0*  ALBUMIN 4.5 4.1 3.8 3.5 3.4*   Recent Labs  Lab 06/23/20 1036 06/24/20 1118  LIPASE 32 28   No results for input(s): AMMONIA in the last 168 hours. CBC: Recent Labs  Lab 06/23/20 2150 06/24/20 1118 06/25/20 0419 06/27/20 0430 06/28/20 0425  WBC 15.2* 10.4 10.4 7.9 5.6  NEUTROABS 12.4*  --   --  5.1 3.3  HGB 13.2 12.3 12.4 12.7 11.8*  HCT 38.5 38.9 38.9 38.5 35.6*  MCV 85.2 93.3 93.1 89.1 88.6  PLT 387 127* 279 275 255   Cardiac Enzymes: No results for input(s): CKTOTAL, CKMB, CKMBINDEX, TROPONINI in the last 168 hours. BNP: BNP (last 3 results) No results for input(s):  BNP in the last 8760 hours.  ProBNP (last 3 results) No results for input(s): PROBNP in the last 8760 hours.  CBG: No results for input(s): GLUCAP in the last 168 hours.     Signed:  08/25/20 MD.  [facility] 06/28/2020, 3:20 PM

## 2020-06-28 NOTE — Progress Notes (Signed)
Discharge instructions reviewed with pt, pt acknowledged understanding. SRP, RN

## 2020-06-28 NOTE — Progress Notes (Signed)
Pt c/o of nausea, medicated as ordered. SRP, RN

## 2020-06-30 ENCOUNTER — Other Ambulatory Visit: Payer: Self-pay

## 2020-06-30 LAB — SURGICAL PATHOLOGY

## 2020-07-06 ENCOUNTER — Encounter: Payer: Self-pay | Admitting: Gastroenterology

## 2020-07-10 ENCOUNTER — Encounter: Payer: Self-pay | Admitting: Gastroenterology

## 2020-07-10 ENCOUNTER — Ambulatory Visit (INDEPENDENT_AMBULATORY_CARE_PROVIDER_SITE_OTHER): Payer: Medicare Other | Admitting: Gastroenterology

## 2020-07-10 VITALS — BP 100/70 | HR 80 | Ht 63.0 in | Wt 149.0 lb

## 2020-07-10 DIAGNOSIS — K529 Noninfective gastroenteritis and colitis, unspecified: Secondary | ICD-10-CM

## 2020-07-10 DIAGNOSIS — R110 Nausea: Secondary | ICD-10-CM | POA: Diagnosis not present

## 2020-07-10 DIAGNOSIS — R198 Other specified symptoms and signs involving the digestive system and abdomen: Secondary | ICD-10-CM

## 2020-07-10 DIAGNOSIS — R1084 Generalized abdominal pain: Secondary | ICD-10-CM

## 2020-07-10 DIAGNOSIS — K209 Esophagitis, unspecified without bleeding: Secondary | ICD-10-CM

## 2020-07-10 DIAGNOSIS — K289 Gastrojejunal ulcer, unspecified as acute or chronic, without hemorrhage or perforation: Secondary | ICD-10-CM | POA: Diagnosis not present

## 2020-07-10 MED ORDER — SUCRALFATE 1 GM/10ML PO SUSP
1.0000 g | Freq: Four times a day (QID) | ORAL | 1 refills | Status: AC
Start: 2020-07-10 — End: ?

## 2020-07-10 MED ORDER — PROMETHAZINE HCL 25 MG PO TABS: 25.0000 mg | ORAL_TABLET | Freq: Four times a day (QID) | ORAL | 3 refills | Status: AC | PRN

## 2020-07-10 NOTE — Patient Instructions (Signed)
You have been scheduled for an endoscopy. Please follow written instructions given to you at your visit today. If you use inhalers (even only as needed), please bring them with you on the day of your procedure.   If you are age 67 or older, your body mass index should be between 23-30. Your Body mass index is 26.39 kg/m. If this is out of the aforementioned range listed, please consider follow up with your Primary Care Provider.  If you are age 69 or younger, your body mass index should be between 19-25. Your Body mass index is 26.39 kg/m. If this is out of the aformentioned range listed, please consider follow up with your Primary Care Provider.    STAY on your Plavix for your endoscopy.   START: Carafate - try to take atleast twice daily.   We have sent the following medications to your pharmacy for you to pick up at your convenience: Promethazine  Due to recent changes in healthcare laws, you may see the results of your imaging and laboratory studies on MyChart before your provider has had a chance to review them.  We understand that in some cases there may be results that are confusing or concerning to you. Not all laboratory results come back in the same time frame and the provider may be waiting for multiple results in order to interpret others.  Please give [place] 48 hours in order for your provider to thoroughly review all the results before contacting the office for clarification of your results.   Thank you for choosing me and [facility].  Dr. Meridee Score

## 2020-07-10 NOTE — Progress Notes (Signed)
GASTROENTEROLOGY OUTPATIENT CLINIC VISIT   Primary Care Provider Bailey Mech, PA-C [address] [place] [address] [phone]  Patient Profile: Tiffany Villarreal is a 69 y.o. 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  The patient presents to the [facility] for an evaluation and management of problem(s) noted below:  Problem List 1. Abdominal pain, generalized   2. GJU (gastrojejunal ulcer)   3. Acute esophagitis   4. Nausea without vomiting   5. Chronic diarrhea   6. Abnormal findings on esophagogastroduodenoscopy (EGD)     History of Present Illness Please see initial consultation note from the hospital as well as progress note for full details of HPI.  The patient was recommended that she needed to follow-up at the quatern[facility] center of [facility] where she is previously been followed.  Patient did not want to follow with them and would rather be seen in [place].  Since the patient's discharge she has continued to have abdominal pain.  She is having nausea is worse in the afternoon.  She is still having the discomfort in her right upper quadrant.  Her diarrheal symptoms have improved while on Bentyl.  She is on Protonix.  She had been on Carafate while she was in the hospital but that was discontinued.  She is wondering if Carafate could be added.  The patient as per the previous consultation note has had extensive work-up including years ago undergoing an endoscopy, colonoscopy, cross-sectional imaging, MRI CP, as well as other stool tests.  She is hopeful that we can find an answer to her  issues.  GI Review of Systems Positive as above Negative for dysphagia, odynophagia, vomiting, melena, hematochezia  Review of Systems General: Denies fevers/chills/weight loss further since hospitalization HEENT: Denies oral lesions Cardiovascular: Denies chest pain currently Pulmonary: Denies shortness of breath currently Gastroenterological: See HPI Genitourinary: Denies darkened urine Hematological: Denies easy bruising/bleeding Endocrine: Denies temperature intolerance Dermatological: Denies jaundice Psychological: Mood is stable   Medications Current Outpatient Medications  Medication Sig Dispense Refill  . acetaminophen (TYLENOL) 325 MG tablet Take 650 mg by mouth every 6 (six) hours as needed for mild pain or headache.    . ASPIRIN LOW DOSE 81 MG EC tablet Take 81 mg by mouth daily.    . clopidogrel (PLAVIX) 75 MG tablet Take 75 mg by mouth daily.    Marland Kitchen dicyclomine (BENTYL) 20 MG tablet Take 1 tablet (20 mg total) by mouth 4 (four) times daily -  before meals and at bedtime. 120 tablet 0  . DULoxetine (CYMBALTA) 30 MG capsule Take 1 capsule (30 mg total) by mouth daily. 30 capsule 0  . escitalopram (LEXAPRO) 20 MG tablet Take 1 tablet (20 mg total) by mouth daily. 30 tablet 0  . hydrOXYzine (ATARAX/VISTARIL) 25 MG tablet Take 12.5-25 mg by mouth 3 (three) times daily as needed for anxiety.     Marland Kitchen levothyroxine (SYNTHROID) 50 MCG tablet Take 50 mcg by mouth daily.    . metoprolol succinate (TOPROL-XL) 100 MG 24 hr tablet Take 100 mg by mouth 2 (two) times daily.    . pantoprazole (PROTONIX) 40 MG tablet Take 1 tablet (40 mg total) by mouth 2 (two) times daily. 30 tablet 1  . potassium chloride (  KLOR-CON) 10 MEQ tablet Take 10 mEq by mouth daily.    . promethazine (PHENERGAN) 25 MG tablet Take 1 tablet (25 mg total) by mouth every 6 (six) hours as needed for nausea or vomiting. 30 tablet 3  . QUEtiapine (SEROQUEL) 50 MG tablet Take 100 mg by mouth daily as needed.     .  sucralfate (CARAFATE) 1 GM/10ML suspension Take 10 mLs (1 g total) by mouth 4 (four) times daily. 420 mL 1   No current facility-administered medications for this visit.    Allergies Allergies  Allergen Reactions  . Dilaudid [Hydromorphone Hcl] Anaphylaxis and Other (See Comments)    Mental, psychotic  . Hydromorphone Anaphylaxis, Other (See Comments) and Rash    Delusions - Intolerance Mental, psychotic   . Morphine Swelling  . Morphine And Related Swelling, Anaphylaxis, Nausea And Vomiting and Rash    Of lips and tongue Of lips and tongue Of lips and tongue Mental, psychotic Of lips and tongue From [facility] records via Care Everywhere   . Tetanus Toxoids Other (See Comments) and Rash    fever fever   . Gluten Meal     Other reaction(s): Other (See Comments) Intolerance intolerence   . Nsaids Other (See Comments)    Upset stomach, hx of GI bleed  . Prochlorperazine Rash    Stiff neck Stiffness Stiff neck Stiff neck stiffness   . Compazine [Prochlorperazine Edisylate] Other (See Comments)    Stiff neck    Histories Past Medical History:  Diagnosis Date  . Anemia    takes Iron caps daily  . Anxiety    takes Remeron and Adderall  . Arthritis   . Back pain   . Chronic back pain   . Depression    takes Buspar,Klonopin,and Sertraline daily  . Diarrhea    since stomach surgeries  . Dizziness    related spine   . Dysrhythmia    takes Metoprolol daily  . History of blood transfusion    no abnormal reaction noted  . History of bronchitis    recently and was on and has completed an antibiotic  . History of colon polyps   . History of migraine    last one a few wks ago and takes ZOmig prn  . Hyperlipidemia    takes Livalo daily  . Hypothyroidism    but doesn't require meds  . Interstitial cystitis    takes Uribel and Elmiron bid   . Muscle spasms of head and/or neck    takes Zanaflex prn  . Osteoarthritis   . Peripheral edema   . Pneumonia    last  time a few yrs ago  . PONV (postoperative nausea and vomiting)    extreme vomiting;pt states she had to have a picc line  after 2 surgeries  . Sleep apnea    uses CPAP  . Urinary frequency   . Urinary urgency    Past Surgical History:  Procedure Laterality Date  . ABDOMINAL HYSTERECTOMY  1988  . APPENDECTOMY  1970  . BIOPSY  06/27/2020   Procedure: BIOPSY;  Surgeon: Meridee Score Netty Starring., MD;  Location: [facility];  Service: Gastroenterology;;  . bladder tumor removed  2002   benign  . ceasarian    . CHOLECYSTECTOMY  2010  . COLONOSCOPY    . CORONARY ANGIOPLASTY WITH STENT PLACEMENT  05/2020   [facility]  . ESOPHAGOGASTRODUODENOSCOPY    . ESOPHAGOGASTRODUODENOSCOPY (EGD) WITH PROPOFOL N/A 06/27/2020   Procedure: ESOPHAGOGASTRODUODENOSCOPY (EGD) WITH PROPOFOL;  Surgeon: Meridee Score,  Netty Starring., MD;  Location: [facility];  Service: Gastroenterology;  Laterality: N/A;  . LAPAROSCOPIC GASTROTOMY W/ REPAIR OF ULCER    . PACEMAKER INSERTION  01/2020  . THORACIC DISCECTOMY  10/30/2012   Procedure: THORACIC DISCECTOMY;  Surgeon: Temple Pacini, MD;  Location: [facility];  Service: Neurosurgery;  Laterality: Right;  Dr. Dorris Fetch to perform Right thoracotomy for exposure  . THORACIC EXPOSURE  10/30/2012   Procedure: THORACIC EXPOSURE;  Surgeon: Loreli Slot, MD;  Location: [facility];  Service: Thoracic;  Laterality: Right;  Thoracic Exposure  . TONSILLECTOMY  1969   Social History   Socioeconomic History  . Marital status: Married    Spouse name: Not on file  . Number of children: Not on file  . Years of education: Not on file  . Highest education level: Not on file  Occupational History  . Not on file  Tobacco Use  . Smoking status: Never Smoker  . Smokeless tobacco: Never Used  Substance and Sexual Activity  . Alcohol use: No  . Drug use: No  . Sexual activity: Yes    Birth control/protection: Surgical  Other Topics Concern  . Not on file  Social History  Narrative  . Not on file   Social Determinants of Health   Financial Resource Strain:   . Difficulty of Paying Living Expenses: Not on file  Food Insecurity:   . Worried About Programme researcher, broadcasting/film/video in the Last Year: Not on file  . Ran Out of Food in the Last Year: Not on file  Transportation Needs:   . Lack of Transportation (Medical): Not on file  . Lack of Transportation (Non-Medical): Not on file  Physical Activity:   . Days of Exercise per Week: Not on file  . Minutes of Exercise per Session: Not on file  Stress:   . Feeling of Stress : Not on file  Social Connections:   . Frequency of Communication with Friends and Family: Not on file  . Frequency of Social Gatherings with Friends and Family: Not on file  . Attends Religious Services: Not on file  . Active Member of Clubs or Organizations: Not on file  . Attends Banker Meetings: Not on file  . Marital Status: Not on file  Intimate Partner Violence:   . Fear of Current or Ex-Partner: Not on file  . Emotionally Abused: Not on file  . Physically Abused: Not on file  . Sexually Abused: Not on file   Family History  Problem Relation Age of Onset  . Colon polyps Mother   . Ulcerative colitis Mother   . Irritable bowel syndrome Mother   . Inflammatory bowel disease Mother   . Diabetes Father   . Breast cancer Cousin        paternal side  . Uterine cancer Maternal Aunt   . Breast cancer Maternal Aunt   . Colon cancer Neg Hx   . Esophageal cancer Neg Hx   . Pancreatic cancer Neg Hx   . Liver disease Neg Hx   . Stomach cancer Neg Hx   . Rectal cancer Neg Hx    I have reviewed her medical, social, and family history in detail and updated the electronic medical record as necessary.    PHYSICAL EXAMINATION  BP 100/70   Pulse 80   Ht  (1.6 m)   Wt 149 lb (67.6 kg)   SpO2 98%   BMI 26.39 kg/m  Wt Readings from Last  3 Encounters:  07/10/20 149 lb (67.6 kg)  06/24/20 143 lb 11.2 oz (65.2 kg)   06/23/20 150 lb (68 kg)  GEN: NAD, appears stated age, doesn't appear chronically ill, accompanied by husband PSYCH: Cooperative, without pressured speech EYE: Conjunctivae pink, sclerae anicteric ENT: MMM CV: Nontachycardic RESP: No audible wheezing GI: Soft, ND, without rebound or guarding GU: DRE shows MSK/EXT: No lower extremity edema SKIN: No jaundice NEURO:  Alert & Oriented x 3, no focal deficits   REVIEW OF DATA  I reviewed the following data at the time of this encounter:  GI Procedures and Studies  Previously reviewed in consult note from hospital  Laboratory Studies  Reviewed those in epic  Imaging Studies  Previously reviewed   ASSESSMENT  Tiffany Villarreal is a 69 y.o. 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  The patient is seen today for evaluation and management of:  1. Abdominal pain, generalized   2. GJU (gastrojejunal ulcer)   3. Acute esophagitis   4. Nausea without vomiting   5. Chronic diarrhea   6. Abnormal findings on esophagogastroduodenoscopy (EGD)    The patient is hemodynamically stable.  The patient's complex history is such that she likely will benefit from follow-up with GI [facility].  She is not sure that she wants to go back there at this time.  I will do my best to try and understand some of the issues she is doing.  We are going to add Carafate to her medications to see if that will help in regards to soothing the esophagitis as well as the gastrojejunal anastomosis erosions/ulceration that was present.  Repeat endoscopic evaluation in 8 to 10  weeks would be reasonable to reevaluate and ensure healing.  The patient's symptoms of diarrhea, recur, then flexible sigmoidoscopy versus colonoscopy should be considered to rule out other etiologies for her symptoms including microscopic or collagenous colitis.  She is certainly at risk for bile salt diarrhea in the setting of her previous cholecystectomy.  In the setting of all of her prior surgeries and her abdomen, I suspect there is a component of nerve pain that may require neuromodulation.  The most significant issue however is that I would not feel comfortable placing her on any medications for neuromodulation because she has a significant psychiatric past and is on multiple agents at this time.  Any changes in her medications will require us to get permission from her psychiatrist to move forward with that.  We will see how she does with the Carafate and decide on additional work-up from there.  The risks and benefits of endoscopic evaluation were discussed with the patient; these include but are not limited to the risk of perforation, infection, bleeding, missed lesions, lack of diagnosis, severe illness requiring hospitalization, as well as anesthesia and sedation related illnesses.  The patient is agreeable to proceed.    PLAN  Continue Protonix twice daily Initiate Carafate twice daily plus nightly Continue Phenergan as needed (she has many allergies to all the other antiemetics) Solid-phase gastric emptying study may be considered in future Repeat diagnostic endoscopy for surveillance in 8 to 10 weeks to check on healing of esophagitis and gastrojejunal anastomosis May consider FD  guard in future Unable to initiate TCA due to the multiple anxiolytics and mood stabilizers that she is on May need colonoscopy or at least a flexible sigmoidoscopy if diarrhea recurs Consider WelChol use in future Consider EPI evaluation in future Consider SIBO breath testing evaluation in future   Orders  Placed This Encounter  Procedures  . Ambulatory referral to Gastroenterology    New Prescriptions   SUCRALFATE (CARAFATE) 1 GM/10ML SUSPENSION    Take 10 mLs (1 g total) by mouth 4 (four) times daily.   Modified Medications   Modified Medication Previous Medication   PROMETHAZINE (PHENERGAN) 25 MG TABLET promethazine (PHENERGAN) 25 MG tablet      Take 1 tablet (25 mg total) by mouth every 6 (six) hours as needed for nausea or vomiting.    Take 1 tablet (25 mg total) by mouth every 6 (six) hours as needed for nausea or vomiting.    Planned Follow Up No follow-ups on file.   Total Time in Face-to-Face and in Coordination of Care for patient including independent/personal interpretation/review of prior testing, medical history, examination, medication adjustment, communicating results with the patient directly, and documentation with the EHR is 35 minutes.  No   Corliss Parish, MD [facility] Gastroenterology Advanced Endoscopy Office # [phone]

## 2020-07-12 ENCOUNTER — Encounter: Payer: Self-pay | Admitting: Gastroenterology

## 2020-07-12 DIAGNOSIS — R198 Other specified symptoms and signs involving the digestive system and abdomen: Secondary | ICD-10-CM | POA: Insufficient documentation

## 2020-07-12 DIAGNOSIS — K529 Noninfective gastroenteritis and colitis, unspecified: Secondary | ICD-10-CM | POA: Insufficient documentation

## 2020-07-12 DIAGNOSIS — R197 Diarrhea, unspecified: Secondary | ICD-10-CM | POA: Insufficient documentation

## 2020-07-12 DIAGNOSIS — R1084 Generalized abdominal pain: Secondary | ICD-10-CM | POA: Insufficient documentation

## 2020-07-12 DIAGNOSIS — K289 Gastrojejunal ulcer, unspecified as acute or chronic, without hemorrhage or perforation: Secondary | ICD-10-CM | POA: Insufficient documentation

## 2020-07-12 DIAGNOSIS — R110 Nausea: Secondary | ICD-10-CM | POA: Insufficient documentation

## 2020-08-18 ENCOUNTER — Encounter: Payer: Self-pay | Admitting: Certified Registered Nurse Anesthetist

## 2020-08-19 ENCOUNTER — Other Ambulatory Visit (INDEPENDENT_AMBULATORY_CARE_PROVIDER_SITE_OTHER): Payer: Medicare Other

## 2020-08-19 ENCOUNTER — Ambulatory Visit (AMBULATORY_SURGERY_CENTER): Payer: Medicare Other | Admitting: Gastroenterology

## 2020-08-19 ENCOUNTER — Other Ambulatory Visit: Payer: Self-pay

## 2020-08-19 ENCOUNTER — Encounter: Payer: Self-pay | Admitting: Gastroenterology

## 2020-08-19 VITALS — BP 119/65 | HR 80 | Temp 97.5°F | Resp 18 | Ht 63.0 in | Wt 149.0 lb

## 2020-08-19 DIAGNOSIS — R1084 Generalized abdominal pain: Secondary | ICD-10-CM

## 2020-08-19 DIAGNOSIS — K208 Other esophagitis without bleeding: Secondary | ICD-10-CM | POA: Diagnosis not present

## 2020-08-19 DIAGNOSIS — K3189 Other diseases of stomach and duodenum: Secondary | ICD-10-CM | POA: Diagnosis not present

## 2020-08-19 DIAGNOSIS — K317 Polyp of stomach and duodenum: Secondary | ICD-10-CM | POA: Diagnosis not present

## 2020-08-19 LAB — CBC WITH DIFFERENTIAL/PLATELET
Basophils Absolute: 0 10*3/uL (ref 0.0–0.1)
Basophils Relative: 0.4 % (ref 0.0–3.0)
Eosinophils Absolute: 0.3 10*3/uL (ref 0.0–0.7)
Eosinophils Relative: 4.9 % (ref 0.0–5.0)
HCT: 39.1 % (ref 36.0–46.0)
Hemoglobin: 13 g/dL (ref 12.0–15.0)
Lymphocytes Relative: 15.6 % (ref 12.0–46.0)
Lymphs Abs: 1 10*3/uL (ref 0.7–4.0)
MCHC: 33.4 g/dL (ref 30.0–36.0)
MCV: 89.2 fl (ref 78.0–100.0)
Monocytes Absolute: 0.4 10*3/uL (ref 0.1–1.0)
Monocytes Relative: 5.7 % (ref 3.0–12.0)
Neutro Abs: 4.8 10*3/uL (ref 1.4–7.7)
Neutrophils Relative %: 73.4 % (ref 43.0–77.0)
Platelets: 272 10*3/uL (ref 150.0–400.0)
RBC: 4.38 Mil/uL (ref 3.87–5.11)
RDW: 13.5 % (ref 11.5–15.5)
WBC: 6.5 10*3/uL (ref 4.0–10.5)

## 2020-08-19 LAB — COMPREHENSIVE METABOLIC PANEL
ALT: 23 U/L (ref 0–35)
AST: 22 U/L (ref 0–37)
Albumin: 3.8 g/dL (ref 3.5–5.2)
Alkaline Phosphatase: 93 U/L (ref 39–117)
BUN: 16 mg/dL (ref 6–23)
CO2: 29 mEq/L (ref 19–32)
Calcium: 8.8 mg/dL (ref 8.4–10.5)
Chloride: 103 mEq/L (ref 96–112)
Creatinine, Ser: 0.83 mg/dL (ref 0.40–1.20)
GFR: 72.95 mL/min (ref >60.00)
Glucose, Bld: 90 mg/dL (ref 70–99)
Potassium: 4.3 mEq/L (ref 3.5–5.1)
Sodium: 139 mEq/L (ref 135–145)
Total Bilirubin: 0.5 mg/dL (ref 0.2–1.2)
Total Protein: 6.5 g/dL (ref 6.0–8.3)

## 2020-08-19 LAB — LIPASE: Lipase: 8 U/L — ABNORMAL LOW (ref 11.0–59.0)

## 2020-08-19 LAB — AMYLASE: Amylase: 46 U/L (ref 27–131)

## 2020-08-19 MED ORDER — SODIUM CHLORIDE 0.9 % IV SOLN: 500.0000 mL | Freq: Once | INTRAVENOUS | Status: DC

## 2020-08-19 MED ORDER — SODIUM CHLORIDE 0.9 % IV SOLN
4.0000 mg | Freq: Once | INTRAVENOUS | Status: AC
  Administered 2020-08-19: 4 mg via INTRAVENOUS

## 2020-08-19 NOTE — Progress Notes (Signed)
0950 Ephedrine 10 mg given IV due to low BP, MD updated.  

## 2020-08-19 NOTE — Patient Instructions (Signed)
Please read handouts provided. Continue present medications. Await pathology results. May restart Plavix in 48 hours. FDguard samples 2 capsules twice daily.     YOU HAD AN ENDOSCOPIC PROCEDURE TODAY AT THE [facility]:   Refer to the procedure report that was given to you for any specific questions about what was found during the examination.  If the procedure report does not answer your questions, please call your gastroenterologist to clarify.  If you requested that your care partner not be given the details of your procedure findings, then the procedure report has been included in a sealed envelope for you to review at your convenience later.  YOU SHOULD EXPECT: Some feelings of bloating in the abdomen. Passage of more gas than usual.  Walking can help get rid of the air that was put into your GI tract during the procedure and reduce the bloating. If you had a lower endoscopy (such as a colonoscopy or flexible sigmoidoscopy) you may notice spotting of blood in your stool or on the toilet paper. If you underwent a bowel prep for your procedure, you may not have a normal bowel movement for a few days.  Please Note:  You might notice some irritation and congestion in your nose or some drainage.  This is from the oxygen used during your procedure.  There is no need for concern and it should clear up in a day or so.  SYMPTOMS TO REPORT IMMEDIATELY:     Following upper endoscopy (EGD)  Vomiting of blood or coffee ground material  New chest pain or pain under the shoulder blades  Painful or persistently difficult swallowing  New shortness of breath  Fever of 100F or higher  Black, tarry-looking stools  For urgent or emergent issues, a gastroenterologist can be reached at any hour by calling [phone]. Do not use MyChart messaging for urgent concerns.    DIET:  We do recommend a small meal at first, but then you may proceed to your regular diet.  Drink plenty of fluids  but you should avoid alcoholic beverages for 24 hours.  ACTIVITY:  You should plan to take it easy for the rest of today and you should NOT DRIVE or use heavy machinery until tomorrow (because of the sedation medicines used during the test).    FOLLOW UP: Our staff will call the number listed on your records 48-72 hours following your procedure to check on you and address any questions or concerns that you may have regarding the information given to you following your procedure. If we do not reach you, we will leave a message.  We will attempt to reach you two times.  During this call, we will ask if you have developed any symptoms of COVID 19. If you develop any symptoms (ie: fever, flu-like symptoms, shortness of breath, cough etc.) before then, please call [phone].  If you test positive for Covid 19 in the 2 weeks post procedure, please call and report this information to [place].    If any biopsies were taken you will be contacted by phone or by letter within the next 1-3 weeks.  Please call us at [phone] if you have not heard about the biopsies in 3 weeks.    SIGNATURES/CONFIDENTIALITY: You and/or your care partner have signed paperwork which will be entered into your electronic medical record.  These signatures attest to the fact that that the information above on your After Visit Summary has been reviewed and is understood.  Full  responsibility of the confidentiality of this discharge information lies with you and/or your care-partner.

## 2020-08-19 NOTE — Progress Notes (Signed)
Report given to PACU, vss 

## 2020-08-19 NOTE — Op Note (Signed)
[place] Patient Name: Tiffany Villarreal General Procedure Date: 08/19/2020 9:36 AM MRN: [id number] Endoscopist: Justice Britain , MD Age: 69 Referring MD:  Date of Birth: [birth year] Gender: Female Account #: [id number] Procedure:                Upper GI endoscopy Indications:              Gastrojejunal ulcer, Follow-up of gastrojejunal                            ulcer, Esophagitis, Follow-up of esophagitis Medicines:                Monitored Anesthesia Care Procedure:                Pre-Anesthesia Assessment:                           - Prior to the procedure, a History and Physical                            was performed, and patient medications and                            allergies were reviewed. The patient's tolerance of                            previous anesthesia was also reviewed. The risks                            and benefits of the procedure and the sedation                            options and risks were discussed with the patient.                            All questions were answered, and informed consent                            was obtained. Prior Anticoagulants: The patient has                            taken Plavix (clopidogrel), last dose was 5 days                            prior to procedure. ASA Grade Assessment: III - A                            patient with severe systemic disease. After                            reviewing the risks and benefits, the patient was                            deemed in satisfactory condition to undergo the  procedure.                           After obtaining informed consent, the endoscope was                            passed under direct vision. Throughout the                            procedure, the patient's blood pressure, pulse, and                            oxygen saturations were monitored continuously. The                            Endoscope was introduced through the  mouth, and                            advanced to the second part of duodenum. The upper                            GI endoscopy was accomplished without difficulty.                            The patient tolerated the procedure. Scope In: Scope Out: Findings:                 No gross lesions were noted in the entire                            esophagus. Previous esophagitis has healed on                            current therapies.                           The Z-line was irregular and was found 36 cm from                            the incisors.                           Evidence of a patent Billroth II gastrojejunostomy                            was found. The gastrojejunal anastomosis was                            characterized by healthy appearing mucosa. This was                            traversed. The efferent limb was examined. The                            afferent limb was examined.  A single 4 mm mucosal papule likely inflammatory                            nodule with no bleeding and no stigmata of recent                            bleeding was found at the anastomosis. The polyp                            was removed with a cold snare. Resection and                            retrieval were complete.                           There was evidence of a patent enteroenterostomy in                            the afferent jejunal loop. This was characterized                            by healthy appearing mucosa. Area demarcating the                            limb that has the entero-enterostomy was tattooed                            with an injection of Spot (carbon black). Complications:            No immediate complications. Estimated Blood Loss:     Estimated blood loss was minimal. Impression:               - No gross lesions in esophagus - prior esophagitis                            has healed. Z-line irregular, 36 cm from the                             incisors.                           - Patent Billroth II gastrojejunostomy was found,                            characterized by healthy appearing mucosa. Previous                            ulceration has healed mostly.                           - A single mucosal papule (nodule) found at the                            anastomosis. Complete removal was accomplished.                           -  Patent enteroenterostomy, characterized by                            healthy appearing mucosa was found in the jejunum.                            Tattooed the limb where this was found. Recommendation:           - The patient will be observed post-procedure,                            until all discharge criteria are met.                           - Discharge patient to home.                           - Patient has a contact number available for                            emergencies. The signs and symptoms of potential                            delayed complications were discussed with the                            patient. Return to normal activities tomorrow.                            Written discharge instructions were provided to the                            patient.                           - Resume previous diet.                           - Continue present medications.                           - Await pathology results.                           - May restart Plavix in 48 hours to decrease risk                            of post-interventional bleeding.                           - Follow up to be scheduled in the coming weeks.                            Further workup not completely defined as to where  we should go. Certainly with prior history of                            cholecystectomy, could consider biliary pathology                            though no LFT abnormalities noted. May consider                            MRI/MRCP. Will get  updated laboratories as well                            including a CBC/CMP/Lipase/Amylase today or next                            available for patient.                           - Unfortunately titration of medications for                            neuromodulation will not be possible with her                            current medications without other providers ability                            to help.                           - FDguard samples, if available will be given (take                            2 capsules twice daily).                           - The findings and recommendations were discussed                            with the patient. Justice Britain, MD 08/19/2020 10:15:31 AM

## 2020-08-19 NOTE — Progress Notes (Signed)
Called to room to assist during endoscopic procedure.  Patient ID and intended procedure confirmed with present staff. Received instructions for my participation in the procedure from the performing physician.  

## 2020-08-19 NOTE — Progress Notes (Signed)
0938 Robinul 0.1 mg IV given due large amount of secretions upon assessment.  MD made aware, vss  ?

## 2020-08-21 ENCOUNTER — Telehealth: Payer: Self-pay

## 2020-08-21 NOTE — Telephone Encounter (Signed)
Covid-19 screening questions   Do you now or have you had a fever in the last 14 days? No.  Do you have any respiratory symptoms of shortness of breath or cough now or in the last 14 days? No.  Do you have any family members or close contacts with diagnosed or suspected Covid-19 in the past 14 days? No.  Have you been tested for Covid-19 and found to be positive? No.       Follow up Call-  Call back number 08/19/2020  Post procedure Call Back phone  # [phone]  Permission to leave phone message Yes  Some recent data might be hidden     Patient questions:  Do you have a fever, pain , or abdominal swelling? No. Pain Score  0 *  Have you tolerated food without any problems? Yes.    Have you been able to return to your normal activities? Yes.    Do you have any questions about your discharge instructions: Diet   No. Medications  No. Follow up visit  No.  Do you have questions or concerns about your Care? No.  Actions: * If pain score is 4 or above: No action needed, pain <4.

## 2020-08-28 ENCOUNTER — Encounter: Payer: Self-pay | Admitting: Gastroenterology
